# Patient Record
Sex: Female | Born: 1960 | Race: White | Hispanic: No | Marital: Married | Smoking: Former smoker
Health system: Southern US, Community
[De-identification: ages and names within clinical notes are randomized; demographics above are authoritative.]

## PROBLEM LIST (undated history)

## (undated) DIAGNOSIS — T8859XA Other complications of anesthesia, initial encounter: Secondary | ICD-10-CM

## (undated) DIAGNOSIS — N63 Unspecified lump in unspecified breast: Secondary | ICD-10-CM

## (undated) DIAGNOSIS — K219 Gastro-esophageal reflux disease without esophagitis: Secondary | ICD-10-CM

## (undated) DIAGNOSIS — R519 Headache, unspecified: Secondary | ICD-10-CM

## (undated) DIAGNOSIS — E785 Hyperlipidemia, unspecified: Secondary | ICD-10-CM

## (undated) DIAGNOSIS — D649 Anemia, unspecified: Secondary | ICD-10-CM

## (undated) DIAGNOSIS — T4145XA Adverse effect of unspecified anesthetic, initial encounter: Secondary | ICD-10-CM

## (undated) DIAGNOSIS — R51 Headache: Secondary | ICD-10-CM

## (undated) DIAGNOSIS — E119 Type 2 diabetes mellitus without complications: Secondary | ICD-10-CM

## (undated) DIAGNOSIS — F419 Anxiety disorder, unspecified: Secondary | ICD-10-CM

## (undated) DIAGNOSIS — E1140 Type 2 diabetes mellitus with diabetic neuropathy, unspecified: Secondary | ICD-10-CM

## (undated) DIAGNOSIS — J40 Bronchitis, not specified as acute or chronic: Secondary | ICD-10-CM

## (undated) HISTORY — DX: Type 2 diabetes mellitus with diabetic neuropathy, unspecified: E11.40

## (undated) HISTORY — DX: Gastro-esophageal reflux disease without esophagitis: K21.9

## (undated) HISTORY — PX: FOOT SURGERY: SHX648

## (undated) HISTORY — PX: CLAVICLE SURGERY: SHX598

## (undated) HISTORY — DX: Type 2 diabetes mellitus without complications: E11.9

## (undated) HISTORY — PX: SHOULDER SURGERY: SHX246

## (undated) HISTORY — DX: Hyperlipidemia, unspecified: E78.5

---

## 2007-04-06 ENCOUNTER — Ambulatory Visit: Payer: Self-pay | Admitting: Family Medicine

## 2007-04-24 ENCOUNTER — Ambulatory Visit: Payer: Self-pay | Admitting: Family Medicine

## 2007-08-10 ENCOUNTER — Ambulatory Visit: Payer: Self-pay | Admitting: Family Medicine

## 2013-09-18 ENCOUNTER — Emergency Department: Payer: Self-pay | Admitting: Emergency Medicine

## 2013-09-18 LAB — TROPONIN I: Troponin-I: <0.02 ng/mL

## 2013-09-18 LAB — CBC
HCT: 42.6 % (ref 35.0–47.0)
HGB: 14.7 g/dL (ref 12.0–16.0)
MCH: 28.5 pg (ref 26.0–34.0)
MCHC: 34.4 g/dL (ref 32.0–36.0)
MCV: 83 fL (ref 80–100)
PLATELETS: 224 10*3/uL (ref 150–440)
RBC: 5.16 10*6/uL (ref 3.80–5.20)
RDW: 13.6 % (ref 11.5–14.5)
WBC: 6.4 10*3/uL (ref 3.6–11.0)

## 2013-09-18 LAB — PRO B NATRIURETIC PEPTIDE: B-Type Natriuretic Peptide: 86 pg/mL (ref 0–125)

## 2013-09-18 LAB — BASIC METABOLIC PANEL
Anion Gap: 9 (ref 7–16)
BUN: 12 mg/dL (ref 7–18)
CHLORIDE: 102 mmol/L (ref 98–107)
CO2: 25 mmol/L (ref 21–32)
Calcium, Total: 9 mg/dL (ref 8.5–10.1)
Creatinine: 0.75 mg/dL (ref 0.60–1.30)
EGFR (Non-African Amer.): >60
Glucose: 267 mg/dL — ABNORMAL HIGH (ref 65–99)
OSMOLALITY: 281 (ref 275–301)
POTASSIUM: 3.6 mmol/L (ref 3.5–5.1)
SODIUM: 136 mmol/L (ref 136–145)

## 2014-03-14 LAB — BASIC METABOLIC PANEL
BUN: 12 mg/dL (ref 4–21)
CREATININE: 0.6 mg/dL (ref <=1.1)
Glucose: 105 mg/dL

## 2014-03-14 LAB — LIPID PANEL
Cholesterol: 233 mg/dL — AB (ref 0–200)
HDL: 43 mg/dL (ref 35–70)
LDL Cholesterol: 156 mg/dL
Triglycerides: 171 mg/dL — AB (ref 40–160)

## 2014-03-25 ENCOUNTER — Ambulatory Visit: Payer: Self-pay | Admitting: Family Medicine

## 2014-03-27 ENCOUNTER — Ambulatory Visit: Payer: Self-pay | Admitting: Family Medicine

## 2014-04-27 ENCOUNTER — Ambulatory Visit: Payer: Self-pay | Admitting: Family Medicine

## 2014-10-01 DIAGNOSIS — E782 Mixed hyperlipidemia: Secondary | ICD-10-CM | POA: Insufficient documentation

## 2014-10-01 DIAGNOSIS — K2100 Gastro-esophageal reflux disease with esophagitis, without bleeding: Secondary | ICD-10-CM | POA: Insufficient documentation

## 2014-10-30 LAB — HEMOGLOBIN A1C: Hgb A1c MFr Bld: 7 % — AB (ref 4.0–6.0)

## 2014-11-07 DIAGNOSIS — E1142 Type 2 diabetes mellitus with diabetic polyneuropathy: Secondary | ICD-10-CM | POA: Insufficient documentation

## 2014-11-07 DIAGNOSIS — E663 Overweight: Secondary | ICD-10-CM | POA: Insufficient documentation

## 2014-11-07 DIAGNOSIS — E119 Type 2 diabetes mellitus without complications: Secondary | ICD-10-CM | POA: Insufficient documentation

## 2014-11-07 DIAGNOSIS — R739 Hyperglycemia, unspecified: Secondary | ICD-10-CM | POA: Insufficient documentation

## 2015-05-18 ENCOUNTER — Other Ambulatory Visit: Payer: Self-pay | Admitting: Family Medicine

## 2015-05-20 ENCOUNTER — Encounter: Payer: Self-pay | Admitting: Family Medicine

## 2015-05-20 ENCOUNTER — Ambulatory Visit (INDEPENDENT_AMBULATORY_CARE_PROVIDER_SITE_OTHER): Payer: BC Managed Care – PPO | Admitting: Family Medicine

## 2015-05-20 VITALS — BP 130/80 | HR 64 | Ht 62.0 in | Wt 254.0 lb

## 2015-05-20 DIAGNOSIS — E1142 Type 2 diabetes mellitus with diabetic polyneuropathy: Secondary | ICD-10-CM | POA: Diagnosis not present

## 2015-05-20 MED ORDER — METFORMIN HCL 1000 MG PO TABS: 1000.0000 mg | ORAL_TABLET | Freq: Two times a day (BID) | ORAL | Status: AC

## 2015-05-20 MED ORDER — LIRAGLUTIDE 18 MG/3ML ~~LOC~~ SOPN: 1.8000 mg | PEN_INJECTOR | Freq: Every day | SUBCUTANEOUS | Status: DC

## 2015-05-20 MED ORDER — PREGABALIN 150 MG PO CAPS: 150.0000 mg | ORAL_CAPSULE | Freq: Two times a day (BID) | ORAL | Status: DC

## 2015-05-20 NOTE — Progress Notes (Signed)
Name: Carolyn Marquez   MRN: [id number]    DOB: 30-Oct-1960   Date:05/20/2015       Progress Note  Subjective  Chief Complaint  Chief Complaint  Patient presents with  . Diabetes    Diabetes She presents for her follow-up diabetic visit. She has type 2 diabetes mellitus. Her disease course has been stable. Pertinent negatives for hypoglycemia include no confusion, dizziness, headaches, hunger, mood changes, nervousness/anxiousness, pallor, seizures, sleepiness, speech difficulty, sweats or tremors. Pertinent negatives for diabetes include no blurred vision, no chest pain, no fatigue, no foot paresthesias, no foot ulcerations, no polydipsia, no polyphagia, no polyuria, no visual change, no weakness and no weight loss. (Neuropathy) There are no hypoglycemic complications. Symptoms are worsening. Diabetic complications include peripheral neuropathy. Pertinent negatives for diabetic complications include no autonomic neuropathy, CVA, heart disease, impotence, nephropathy, PVD or retinopathy. Risk factors for coronary artery disease include diabetes mellitus and dyslipidemia. Current diabetic treatment includes oral agent (monotherapy) (with victoza). She is compliant with treatment all of the time. She is following a generally healthy and diabetic (attended lifestyle) diet. She has had a previous visit with a dietitian. She participates in exercise intermittently. Her home blood glucose trend is fluctuating minimally. Her breakfast blood glucose is taken between 8-9 am. Her breakfast blood glucose range is generally 140-180 mg/dl. An ACE inhibitor/angiotensin II receptor blocker is not being taken. She sees a podiatrist.Eye exam is current.    No problem-specific assessment & plan notes found for this encounter.   Past Medical History  Diagnosis Date  . Diabetes mellitus without complication (HCC)   . Diabetic neuropathy [facility])     Past Surgical History  Procedure Laterality Date  . Cesarean section     . Foot surgery    . Shoulder surgery      Family History  Problem Relation Age of Onset  . Cancer Father   . Diabetes Sister     Social History   Social History  . Marital Status: Married    Spouse Name: N/A  . Number of Children: N/A  . Years of Education: N/A   Occupational History  . Not on file.   Social History Main Topics  . Smoking status: Former Games developer  . Smokeless tobacco: Not on file  . Alcohol Use: No  . Drug Use: No  . Sexual Activity: No   Other Topics Concern  . Not on file   Social History Narrative    No Known Allergies   Review of Systems  Constitutional: Negative for fever, chills, weight loss, malaise/fatigue and fatigue.  HENT: Negative for ear discharge, ear pain and sore throat.   Eyes: Negative for blurred vision.  Respiratory: Negative for cough, sputum production, shortness of breath and wheezing.   Cardiovascular: Negative for chest pain, palpitations and leg swelling.  Gastrointestinal: Negative for heartburn, nausea, abdominal pain, diarrhea, constipation, blood in stool and melena.  Genitourinary: Negative for dysuria, urgency, frequency, hematuria and impotence.  Musculoskeletal: Negative for myalgias, back pain, joint pain and neck pain.  Skin: Negative for pallor and rash.  Neurological: Negative for dizziness, tingling, tremors, sensory change, focal weakness, seizures, speech difficulty, weakness and headaches.  Endo/Heme/Allergies: Negative for environmental allergies, polydipsia and polyphagia. Does not bruise/bleed easily.  Psychiatric/Behavioral: Negative for depression, suicidal ideas and confusion. The patient is not nervous/anxious and does not have insomnia.      Objective  Filed Vitals:   05/20/15 0758  BP: 130/80  Pulse: 64  Height:  (1.575  m)  Weight: 254 lb (115.214 kg)    Physical Exam  Constitutional: She is well-developed, well-nourished, and in no distress. No distress.  HENT:  Head:  Normocephalic and atraumatic.  Right Ear: External ear normal.  Left Ear: External ear normal.  Nose: Nose normal.  Mouth/Throat: Oropharynx is clear and moist.  Eyes: Conjunctivae and EOM are normal. Pupils are equal, round, and reactive to light. Right eye exhibits no discharge. Left eye exhibits no discharge.  Neck: Normal range of motion. Neck supple. No JVD present. No thyromegaly present.  Cardiovascular: Normal rate, regular rhythm, normal heart sounds and intact distal pulses.  Exam reveals no gallop and no friction rub.   No murmur heard. Pulmonary/Chest: Effort normal and breath sounds normal.  Abdominal: Soft. Bowel sounds are normal. She exhibits no mass. There is no tenderness. There is no guarding.  Musculoskeletal: Normal range of motion. She exhibits no edema.  Lymphadenopathy:    She has no cervical adenopathy.  Neurological: She is alert.  Skin: Skin is warm and dry. She is not diaphoretic.  Psychiatric: Mood and affect normal.      Assessment & Plan  Problem List Items Addressed This Visit      Nervous and Auditory   Diabetes mellitus with polyneuropathy (HCC) - Primary   Relevant Medications   Liraglutide 18 MG/3ML SOPN   metFORMIN (GLUCOPHAGE) 1000 MG tablet   pregabalin (LYRICA) 150 MG capsule   Other Relevant Orders   HgB A1c   Renal Function Panel        Dr. Elizabeth Sauereanna Fabricio [facility]  05/20/2015

## 2015-05-21 LAB — RENAL FUNCTION PANEL
Albumin: 4.1 g/dL (ref 3.5–5.5)
BUN/Creatinine Ratio: 20 (ref 9–23)
BUN: 9 mg/dL (ref 6–24)
CO2: 23 mmol/L (ref 18–29)
CREATININE: 0.46 mg/dL — AB (ref 0.57–1.00)
Calcium: 9.3 mg/dL (ref 8.7–10.2)
Chloride: 100 mmol/L (ref 97–106)
GFR, EST AFRICAN AMERICAN: 130 mL/min/{1.73_m2} (ref >59)
GFR, EST NON AFRICAN AMERICAN: 113 mL/min/{1.73_m2} (ref >59)
Glucose: 111 mg/dL — ABNORMAL HIGH (ref 65–99)
Phosphorus: 3.7 mg/dL (ref 2.5–4.5)
Potassium: 4.5 mmol/L (ref 3.5–5.2)
SODIUM: 141 mmol/L (ref 136–144)

## 2015-05-21 LAB — HEMOGLOBIN A1C
ESTIMATED AVERAGE GLUCOSE: 174 mg/dL
HEMOGLOBIN A1C: 7.7 % — AB (ref 4.8–5.6)

## 2015-05-25 ENCOUNTER — Other Ambulatory Visit: Payer: Self-pay

## 2015-05-25 DIAGNOSIS — E0849 Diabetes mellitus due to underlying condition with other diabetic neurological complication: Secondary | ICD-10-CM

## 2015-05-25 MED ORDER — GABAPENTIN 300 MG PO CAPS: 300.0000 mg | ORAL_CAPSULE | Freq: Three times a day (TID) | ORAL | Status: DC

## 2015-06-01 ENCOUNTER — Other Ambulatory Visit: Payer: Self-pay

## 2015-06-01 DIAGNOSIS — E1165 Type 2 diabetes mellitus with hyperglycemia: Principal | ICD-10-CM

## 2015-06-01 DIAGNOSIS — E11319 Type 2 diabetes mellitus with unspecified diabetic retinopathy without macular edema: Secondary | ICD-10-CM

## 2015-06-23 ENCOUNTER — Ambulatory Visit: Payer: BC Managed Care – PPO | Admitting: Family Medicine

## 2015-06-25 ENCOUNTER — Ambulatory Visit (INDEPENDENT_AMBULATORY_CARE_PROVIDER_SITE_OTHER): Payer: BC Managed Care – PPO | Admitting: Family Medicine

## 2015-06-25 ENCOUNTER — Encounter: Payer: Self-pay | Admitting: Family Medicine

## 2015-06-25 VITALS — BP 124/80 | HR 76 | Ht 68.0 in | Wt 258.0 lb

## 2015-06-25 DIAGNOSIS — R079 Chest pain, unspecified: Secondary | ICD-10-CM

## 2015-06-25 DIAGNOSIS — E1140 Type 2 diabetes mellitus with diabetic neuropathy, unspecified: Secondary | ICD-10-CM

## 2015-06-25 DIAGNOSIS — E782 Mixed hyperlipidemia: Secondary | ICD-10-CM | POA: Diagnosis not present

## 2015-06-25 NOTE — Patient Instructions (Signed)
Diabetic Retinopathy Diabetic retinopathy is a disease of the light-sensitive membrane at the back of the eye (retina). It is a complication of diabetes and a common cause of blindness. Early detection of the disease is key to keeping your eyes healthy.  CAUSES  Diabetic retinopathy is caused by blood sugar (glucose) levels that are too high over an extended period of time. High blood sugars cause damage to the small blood vessels of the retina, allowing blood to leak through the vessel walls. This causes visual impairment and eventually blindness. RISK FACTORS  High blood pressure.  Having diabetes for a long time.  Having poorly controlled blood sugars. SIGNS AND SYMPTOMS  In the early stages of diabetic retinopathy, there are often no symptoms. As the condition advances, symptoms may include:  Blurred vision. This is usually caused by a swelling due to abnormal blood glucose levels. The blurriness may go away when blood glucose levels return to normal.  Moving specks or dark spots (floaters) in your vision. These can be caused by a small retinal hemorrhage. A hemorrhage is bleeding from blood vessels.  Missing parts of your field of vision, such as things at the side. This can be caused by larger retinal hemorrhages.  Difficulty reading books or signs.  Double vision.  Pain in one or both eyes.  Feeling pressure in one or both eyes.  Trouble seeing straight lines. Straight lines do not look straight.  Redness of the eyes that does not go away. DIAGNOSIS  Your eye care specialist can detect changes in the blood vessels of your eye by putting drops in your eyes that enlarge (dilate) your pupils. This allows your eye care specialist to get a good look at your retina to see if there are any changes that have occurred as a result of your diabetes. You should have your eyes examined once a year. TREATMENT  Your eye care specialist may use a special laser beam to seal the blood vessels  of the retina and stop them from leaking. Early detection and treatment are important so that further damage to your eyes can be prevented. In addition, managing your blood sugars and keeping them in the target range can slow the progress of the disease. HOME CARE INSTRUCTIONS   Keep your blood pressure within your target range.  Keep your blood glucose levels within your target range.  Follow your health care provider's instructions regarding diet and other means for controlling your blood glucose levels.  Check your blood levels for glucose as recommended by your health care provider.  Keep regular appointments with your eye specialist. An eye specialist can usually see diabetic retinopathy developing long before it starts causing problems. In many cases, it can be treated to prevent complications from occurring. If you have diabetes, you should have your eyes checked at least every year. Your risk of retinopathy increases the longer you have the disease.  If you smoke, quit. Ask your health care provider for help if needed. Smoking can make retinopathy worse. SEEK MEDICAL CARE IF:   You notice gradual blurring or other changes in your vision over time.  You notice that your glasses or contact lenses do not make things look as sharp as they once did.  You have trouble reading or seeing details at a distance with either eye.  You notice a sudden change in your vision or notice that parts of your field of vision appear missing or hazy.  You suddenly see moving specks or dark spots   in the field of vision of either eye.  You have sudden partial loss of vision in either eye.   This information is not intended to replace advice given to you by your health care provider. Make sure you discuss any questions you have with your health care provider.   Document Released: 06/10/2000 Document Revised: 04/03/2013 Document Reviewed: 12/03/2012 Elsevier Interactive Patient Education 2016 [employer]  Inc[employer]  

## 2015-06-25 NOTE — Progress Notes (Signed)
Name: Carolyn Marquez   MRN: [id number]    DOB: 1961-01-24   Date:06/25/2015       Progress Note  Subjective  Chief Complaint  Chief Complaint  Patient presents with  . Diabetes    Diabetes She presents for her follow-up diabetic visit. She has type 2 diabetes mellitus. Her disease course has been fluctuating. There are no hypoglycemic associated symptoms. Pertinent negatives for hypoglycemia include no confusion, dizziness, headaches, hunger, mood changes, nervousness/anxiousness, pallor, seizures, sleepiness, speech difficulty, sweats or tremors. Associated symptoms include chest pain and foot paresthesias. Pertinent negatives for diabetes include no blurred vision, no fatigue, no foot ulcerations, no polydipsia, no polyphagia, no polyuria, no visual change, no weakness and no weight loss. There are no hypoglycemic complications. Pertinent negatives for hypoglycemia complications include no blackouts, no hospitalization, no nocturnal hypoglycemia, no required assistance and no required glucagon injection. Symptoms are stable. Diabetic complications include peripheral neuropathy. Pertinent negatives for diabetic complications include no CVA, heart disease, PVD or retinopathy. Risk factors for coronary artery disease include diabetes mellitus and dyslipidemia. Her weight is stable. She is following a generally healthy diet. She has had a previous visit with a dietitian. Her home blood glucose trend is fluctuating minimally. Her breakfast blood glucose is taken between 8-9 am. Her breakfast blood glucose range is generally 130-140 mg/dl. An ACE inhibitor/angiotensin II receptor blocker is not being taken. She does not see a podiatrist.Eye exam is current.  Chest Pain  This is a chronic problem. The current episode started more than 1 year ago. The onset quality is gradual. The problem occurs intermittently. The problem has been waxing and waning. The pain is present in the substernal region. The pain is at  a severity of 4/10. The quality of the pain is described as tightness. The pain does not radiate. Pertinent negatives include no abdominal pain, back pain, cough, dizziness, exertional chest pressure, fever, headaches, irregular heartbeat, malaise/fatigue, nausea, palpitations, shortness of breath, sputum production or weakness.  Pertinent negatives for past medical history include no heart disease, no PVD and no seizures.    No problem-specific assessment & plan notes found for this encounter.   Past Medical History  Diagnosis Date  . Diabetes mellitus without complication (HCC)   . Diabetic neuropathy [facility])     Past Surgical History  Procedure Laterality Date  . Cesarean section    . Foot surgery    . Shoulder surgery      Family History  Problem Relation Age of Onset  . Cancer Father   . Diabetes Sister     Social History   Social History  . Marital Status: Married    Spouse Name: N/A  . Number of Children: N/A  . Years of Education: N/A   Occupational History  . Not on file.   Social History Main Topics  . Smoking status: Former Games developer  . Smokeless tobacco: Not on file  . Alcohol Use: No  . Drug Use: No  . Sexual Activity: No   Other Topics Concern  . Not on file   Social History Narrative    No Known Allergies   Review of Systems  Constitutional: Negative for fever, chills, weight loss, malaise/fatigue and fatigue.  HENT: Negative for ear discharge, ear pain and sore throat.   Eyes: Negative for blurred vision.  Respiratory: Negative for cough, sputum production, shortness of breath and wheezing.   Cardiovascular: Positive for chest pain. Negative for palpitations and leg swelling.  Gastrointestinal: Negative for heartburn,  nausea, abdominal pain, diarrhea, constipation, blood in stool and melena.  Genitourinary: Negative for dysuria, urgency, frequency and hematuria.  Musculoskeletal: Negative for myalgias, back pain, joint pain and neck pain.   Skin: Negative for pallor and rash.  Neurological: Negative for dizziness, tingling, tremors, sensory change, focal weakness, seizures, speech difficulty, weakness and headaches.  Endo/Heme/Allergies: Negative for environmental allergies, polydipsia and polyphagia. Does not bruise/bleed easily.  Psychiatric/Behavioral: Negative for depression, suicidal ideas and confusion. The patient is not nervous/anxious and does not have insomnia.      Objective  Filed Vitals:   06/25/15 0857  BP: 124/80  Pulse: 76  Height: 5\' 8"  (1.727 m)  Weight: 258 lb (117.028 kg)    Physical Exam  Constitutional: She is well-developed, well-nourished, and in no distress. No distress.  HENT:  Head: Normocephalic and atraumatic.  Right Ear: External ear normal.  Left Ear: External ear normal.  Nose: Nose normal.  Mouth/Throat: Oropharynx is clear and moist.  Eyes: Conjunctivae and EOM are normal. Pupils are equal, round, and reactive to light. Right eye exhibits no discharge. Left eye exhibits no discharge.  Neck: Normal range of motion. Neck supple. No JVD present. No thyromegaly present.  Cardiovascular: Normal rate, regular rhythm, normal heart sounds and intact distal pulses.  Exam reveals no gallop and no friction rub.   No murmur heard. Pulmonary/Chest: Effort normal and breath sounds normal.  Abdominal: Soft. Bowel sounds are normal. She exhibits no mass. There is no tenderness. There is no guarding.  Musculoskeletal: Normal range of motion. She exhibits no edema.  Lymphadenopathy:    She has no cervical adenopathy.  Neurological: She is alert. She has normal sensation, normal strength and normal reflexes.  Skin: Skin is warm and dry. She is not diaphoretic.  Psychiatric: Mood and affect normal.  Nursing note and vitals reviewed.     Assessment & Plan  Problem List Items Addressed This Visit      Endocrine   Diabetes mellitus, type 2 (HCC) - Primary   Relevant Orders   HgB A1c   Urine  Microalbumin w/creat. ratio   Renal Function Panel     Other   Combined fat and carbohydrate induced hyperlipemia   Relevant Orders   Lipid Profile    Other Visit Diagnoses    Chest pain at rest        sceduled appt for stress echo         Dr. Hayden Rasmusseneanna Areana Kosanke [facility]  06/25/2015

## 2015-06-26 LAB — LIPID PANEL
CHOL/HDL RATIO: 5.6 ratio — AB (ref 0.0–4.4)
Cholesterol, Total: 253 mg/dL — ABNORMAL HIGH (ref 100–199)
HDL: 45 mg/dL (ref >39)
LDL CALC: 146 mg/dL — AB (ref 0–99)
TRIGLYCERIDES: 309 mg/dL — AB (ref 0–149)
VLDL CHOLESTEROL CAL: 62 mg/dL — AB (ref 5–40)

## 2015-06-26 LAB — RENAL FUNCTION PANEL
Albumin: 4 g/dL (ref 3.5–5.5)
BUN / CREAT RATIO: 22 (ref 9–23)
BUN: 11 mg/dL (ref 6–24)
CO2: 22 mmol/L (ref 18–29)
CREATININE: 0.51 mg/dL — AB (ref 0.57–1.00)
Calcium: 9.1 mg/dL (ref 8.7–10.2)
Chloride: 97 mmol/L (ref 96–106)
GFR calc Af Amer: 126 mL/min/{1.73_m2} (ref >59)
GFR, EST NON AFRICAN AMERICAN: 109 mL/min/{1.73_m2} (ref >59)
Glucose: 179 mg/dL — ABNORMAL HIGH (ref 65–99)
Phosphorus: 3.8 mg/dL (ref 2.5–4.5)
Potassium: 4.4 mmol/L (ref 3.5–5.2)
SODIUM: 139 mmol/L (ref 134–144)

## 2015-06-26 LAB — HEMOGLOBIN A1C
Est. average glucose Bld gHb Est-mCnc: 174 mg/dL
Hgb A1c MFr Bld: 7.7 % — ABNORMAL HIGH (ref 4.8–5.6)

## 2015-06-26 LAB — MICROALBUMIN / CREATININE URINE RATIO
Creatinine, Urine: 133.3 mg/dL
MICROALB/CREAT RATIO: 4.9 mg/g{creat} (ref 0.0–30.0)
MICROALBUM., U, RANDOM: 6.5 ug/mL

## 2015-06-26 NOTE — Addendum Note (Signed)
Addended by: Everitt AmberLYNCH, Herson Prichard L on: 06/26/2015 03:12 PM   Modules accepted: Orders

## 2015-07-15 DIAGNOSIS — R0681 Apnea, not elsewhere classified: Secondary | ICD-10-CM | POA: Insufficient documentation

## 2015-07-22 ENCOUNTER — Ambulatory Visit (INDEPENDENT_AMBULATORY_CARE_PROVIDER_SITE_OTHER): Payer: BC Managed Care – PPO | Admitting: Family Medicine

## 2015-07-22 ENCOUNTER — Encounter: Payer: Self-pay | Admitting: Family Medicine

## 2015-07-22 VITALS — BP 130/70 | HR 84 | Ht 68.0 in | Wt 249.0 lb

## 2015-07-22 DIAGNOSIS — J0110 Acute frontal sinusitis, unspecified: Secondary | ICD-10-CM | POA: Diagnosis not present

## 2015-07-22 MED ORDER — AMOXICILLIN-POT CLAVULANATE 875-125 MG PO TABS
1.0000 | ORAL_TABLET | Freq: Two times a day (BID) | ORAL | Status: DC
Start: 2015-07-22 — End: 2015-09-07

## 2015-07-22 NOTE — Progress Notes (Signed)
Name: Carolyn Marquez   MRN: [id number]    DOB: [birth year]   Date:07/22/2015       Progress Note  Subjective  Chief Complaint  Chief Complaint  Patient presents with  . Sinusitis    head pressure x 3 days- sudafed and ibuprofen otc not helping. Cough- light green production    Sinusitis This is a new problem. The current episode started in the past 7 days. The problem has been gradually worsening since onset. There has been no fever. Her pain is at a severity of 6/10. The pain is moderate. Associated symptoms include congestion, diaphoresis, ear pain, headaches, sinus pressure and sneezing. Pertinent negatives include no chills, coughing, neck pain, shortness of breath or sore throat. Past treatments include acetaminophen and oral decongestants. The treatment provided no relief.    No problem-specific assessment & plan notes found for this encounter.   Past Medical History  Diagnosis Date  . Diabetes mellitus without complication (HCC)   . Diabetic neuropathy [facility])     Past Surgical History  Procedure Laterality Date  . Cesarean section    . Foot surgery    . Shoulder surgery      Family History  Problem Relation Age of Onset  . Cancer Father   . Diabetes Sister     Social History   Social History  . Marital Status: Married    Spouse Name: N/A  . Number of Children: N/A  . Years of Education: N/A   Occupational History  . Not on file.   Social History Main Topics  . Smoking status: Former Games developer  . Smokeless tobacco: Not on file  . Alcohol Use: No  . Drug Use: No  . Sexual Activity: No   Other Topics Concern  . Not on file   Social History Narrative    No Known Allergies   Review of Systems  Constitutional: Positive for diaphoresis. Negative for fever, chills, weight loss and malaise/fatigue.  HENT: Positive for congestion, ear pain, sinus pressure and sneezing. Negative for ear discharge and sore throat.   Eyes: Negative for blurred vision.   Respiratory: Negative for cough, sputum production, shortness of breath and wheezing.   Cardiovascular: Negative for chest pain, palpitations and leg swelling.  Gastrointestinal: Positive for nausea. Negative for heartburn, abdominal pain, diarrhea, constipation, blood in stool and melena.  Genitourinary: Negative for dysuria, urgency, frequency and hematuria.  Musculoskeletal: Negative for myalgias, back pain, joint pain and neck pain.  Skin: Negative for rash.  Neurological: Positive for headaches. Negative for dizziness, tingling, sensory change and focal weakness.  Endo/Heme/Allergies: Negative for environmental allergies and polydipsia. Does not bruise/bleed easily.  Psychiatric/Behavioral: Negative for depression and suicidal ideas. The patient is not nervous/anxious and does not have insomnia.      Objective  Filed Vitals:   07/22/15 1041  BP: 130/70  Pulse: 84  Height:  (1.727 m)  Weight: 249 lb (112.946 kg)    Physical Exam  Constitutional: She is well-developed, well-nourished, and in no distress. No distress.  HENT:  Head: Normocephalic and atraumatic.  Right Ear: External ear normal.  Left Ear: External ear normal.  Nose: Nose normal.  Mouth/Throat: Oropharynx is clear and moist.  Eyes: Conjunctivae and EOM are normal. Pupils are equal, round, and reactive to light. Right eye exhibits no discharge. Left eye exhibits no discharge.  Neck: Normal range of motion. Neck supple. No JVD present. No thyromegaly present.  Cardiovascular: Normal rate, regular rhythm, normal heart sounds and intact  distal pulses.  Exam reveals no gallop and no friction rub.   No murmur heard. Pulmonary/Chest: Effort normal and breath sounds normal. She has no wheezes. She has no rales.  Abdominal: Soft. Bowel sounds are normal. She exhibits no mass. There is no tenderness. There is no guarding.  Musculoskeletal: Normal range of motion. She exhibits no edema.  Lymphadenopathy:    She has  no cervical adenopathy.  Neurological: She is alert.  Skin: Skin is warm and dry. She is not diaphoretic.  Psychiatric: Mood and affect normal.      Assessment & Plan  Problem List Items Addressed This Visit    None    Visit Diagnoses    Acute frontal sinusitis, recurrence not specified    -  Primary    Relevant Medications    amoxicillin-clavulanate (AUGMENTIN) 875-125 MG tablet         Dr. [facility]den [facility]  07/22/2015

## 2015-08-18 ENCOUNTER — Encounter: Payer: Self-pay | Admitting: Family Medicine

## 2015-08-18 ENCOUNTER — Ambulatory Visit (INDEPENDENT_AMBULATORY_CARE_PROVIDER_SITE_OTHER): Payer: BC Managed Care – PPO | Admitting: Family Medicine

## 2015-08-18 VITALS — BP 120/78 | HR 76 | Temp 98.0°F | Wt 243.8 lb

## 2015-08-18 DIAGNOSIS — J0100 Acute maxillary sinusitis, unspecified: Secondary | ICD-10-CM

## 2015-08-18 DIAGNOSIS — J40 Bronchitis, not specified as acute or chronic: Secondary | ICD-10-CM | POA: Diagnosis not present

## 2015-08-18 MED ORDER — GUAIFENESIN-CODEINE 100-10 MG/5ML PO SYRP: 5.0000 mL | ORAL_SOLUTION | Freq: Three times a day (TID) | ORAL | Status: DC | PRN

## 2015-08-18 MED ORDER — AZITHROMYCIN 250 MG PO TABS: ORAL_TABLET | ORAL | Status: DC

## 2015-08-18 MED ORDER — ALBUTEROL SULFATE HFA 108 (90 BASE) MCG/ACT IN AERS: 2.0000 | INHALATION_SPRAY | Freq: Four times a day (QID) | RESPIRATORY_TRACT | Status: DC | PRN

## 2015-08-18 NOTE — Progress Notes (Signed)
Name: Carolyn Marquez   MRN: [id number]    DOB: [birth year]   Date:08/18/2015       Progress Note  Subjective  Chief Complaint  Chief Complaint  Patient presents with  . Sinusitis    cough and cong    Sinusitis This is a new problem. The current episode started in the past 7 days. The problem has been waxing and waning since onset. There has been no fever. The pain is mild. Pertinent negatives include no chills, congestion, coughing, diaphoresis, ear pain, headaches, hoarse voice, neck pain, shortness of breath, sinus pressure, sneezing, sore throat or swollen glands. Past treatments include acetaminophen. The treatment provided mild relief.  Cough This is a new problem. The cough is non-productive. Pertinent negatives include no chest pain, chills, ear pain, fever, headaches, heartburn, myalgias, rash, sore throat, shortness of breath, weight loss or wheezing. There is no history of environmental allergies.    No problem-specific assessment & plan notes found for this encounter.   Past Medical History  Diagnosis Date  . Diabetes mellitus without complication (HCC)   . Diabetic neuropathy [facility])     Past Surgical History  Procedure Laterality Date  . Cesarean section    . Foot surgery    . Shoulder surgery      Family History  Problem Relation Age of Onset  . Cancer Father   . Diabetes Sister     Social History   Social History  . Marital Status: Married    Spouse Name: N/A  . Number of Children: N/A  . Years of Education: N/A   Occupational History  . Not on file.   Social History Main Topics  . Smoking status: Former Games developer  . Smokeless tobacco: Not on file  . Alcohol Use: No  . Drug Use: No  . Sexual Activity: No   Other Topics Concern  . Not on file   Social History Narrative    No Known Allergies   Review of Systems  Constitutional: Negative for fever, chills, weight loss, malaise/fatigue and diaphoresis.  HENT: Negative for congestion, ear  discharge, ear pain, hoarse voice, sinus pressure, sneezing and sore throat.   Eyes: Negative for blurred vision.  Respiratory: Negative for cough, sputum production, shortness of breath and wheezing.   Cardiovascular: Negative for chest pain, palpitations and leg swelling.  Gastrointestinal: Negative for heartburn, nausea, abdominal pain, diarrhea, constipation, blood in stool and melena.  Genitourinary: Negative for dysuria, urgency, frequency and hematuria.  Musculoskeletal: Negative for myalgias, back pain, joint pain and neck pain.  Skin: Negative for rash.  Neurological: Negative for dizziness, tingling, sensory change, focal weakness and headaches.  Endo/Heme/Allergies: Negative for environmental allergies and polydipsia. Does not bruise/bleed easily.  Psychiatric/Behavioral: Negative for depression and suicidal ideas. The patient is not nervous/anxious and does not have insomnia.      Objective  Filed Vitals:   08/18/15 0843  BP: 120/78  Pulse: 76  Temp: 98 F (36.7 C)  TempSrc: Oral  Weight: 243 lb 12.8 oz (110.587 kg)  SpO2: 97%    Physical Exam  Constitutional: She is well-developed, well-nourished, and in no distress. No distress.  HENT:  Head: Normocephalic and atraumatic.  Right Ear: External ear normal.  Left Ear: External ear normal.  Nose: Nose normal.  Mouth/Throat: Oropharynx is clear and moist.  Eyes: Conjunctivae and EOM are normal. Pupils are equal, round, and reactive to light. Right eye exhibits no discharge. Left eye exhibits no discharge.  Neck: Normal range of  motion. Neck supple. No JVD present. No thyromegaly present.  Cardiovascular: Normal rate, regular rhythm, normal heart sounds and intact distal pulses.  Exam reveals no gallop and no friction rub.   No murmur heard. Pulmonary/Chest: Effort normal and breath sounds normal.  Abdominal: Soft. Bowel sounds are normal. She exhibits no mass. There is no tenderness. There is no guarding.   Musculoskeletal: Normal range of motion. She exhibits no edema.  Lymphadenopathy:    She has no cervical adenopathy.  Neurological: She is alert. She has normal reflexes.  Skin: Skin is warm and dry. She is not diaphoretic.  Psychiatric: Mood and affect normal.  Nursing note and vitals reviewed.     Assessment & Plan  Problem List Items Addressed This Visit    None    Visit Diagnoses    Acute maxillary sinusitis, recurrence not specified    -  Primary    Relevant Medications    azithromycin (ZITHROMAX Z-PAK) 250 MG tablet    guaiFENesin-codeine (ROBITUSSIN AC) 100-10 MG/5ML syrup    Bronchitis        Relevant Medications    azithromycin (ZITHROMAX Z-PAK) 250 MG tablet    guaiFENesin-codeine (ROBITUSSIN AC) 100-10 MG/5ML syrup    albuterol (PROVENTIL HFA;VENTOLIN HFA) 108 (90 Base) MCG/ACT inhaler         Dr. [facility]  08/18/2015

## 2015-09-07 ENCOUNTER — Ambulatory Visit (INDEPENDENT_AMBULATORY_CARE_PROVIDER_SITE_OTHER): Payer: BC Managed Care – PPO | Admitting: Family Medicine

## 2015-09-07 ENCOUNTER — Encounter: Payer: Self-pay | Admitting: Family Medicine

## 2015-09-07 VITALS — BP 120/84 | HR 76 | Ht 63.0 in | Wt 243.0 lb

## 2015-09-07 DIAGNOSIS — N63 Unspecified lump in breast: Secondary | ICD-10-CM

## 2015-09-07 DIAGNOSIS — N6320 Unspecified lump in the left breast, unspecified quadrant: Secondary | ICD-10-CM

## 2015-09-07 NOTE — Progress Notes (Signed)
Name: Carolyn Marquez   MRN: [id number]    DOB: 04/13/1961   Date:09/07/2015       Progress Note  Subjective  Chief Complaint  Chief Complaint  Patient presents with  . Breast Mass    felt it 1 week ago while adjusting bra- no pain or tenderness or discharge- last mammo has been "probably 10 years ago"    HPI Comments: Patient presents with palpable breast mass. (nontender/ niclel size/ no discharge)  Other This is a new problem. The current episode started in the past 7 days. Pertinent negatives include no abdominal pain, change in bowel habit, chest pain, chills, coughing, fever, headaches, myalgias, nausea, neck pain, rash or sore throat. Associated symptoms comments: Palpable breast mass. Nothing aggravates the symptoms.    No problem-specific assessment & plan notes found for this encounter.   Past Medical History  Diagnosis Date  . Diabetes mellitus without complication (HCC)   . Diabetic neuropathy [facility](HCC)     Past Surgical History  Procedure Laterality Date  . Cesarean section    . Foot surgery    . Shoulder surgery      Family History  Problem Relation Age of Onset  . Cancer Father   . Diabetes Sister     Social History   Social History  . Marital Status: Married    Spouse Name: N/A  . Number of Children: N/A  . Years of Education: N/A   Occupational History  . Not on file.   Social History Main Topics  . Smoking status: Former Games developermoker  . Smokeless tobacco: Not on file  . Alcohol Use: No  . Drug Use: No  . Sexual Activity: No   Other Topics Concern  . Not on file   Social History Narrative    No Known Allergies   Review of Systems  Constitutional: Negative for fever, chills, weight loss and malaise/fatigue.  HENT: Negative for ear discharge, ear pain and sore throat.   Eyes: Negative for blurred vision.  Respiratory: Negative for cough, sputum production, shortness of breath and wheezing.   Cardiovascular: Negative for chest pain, palpitations  and leg swelling.  Gastrointestinal: Negative for heartburn, nausea, abdominal pain, diarrhea, constipation, blood in stool, melena and change in bowel habit.  Genitourinary: Negative for dysuria, urgency, frequency and hematuria.  Musculoskeletal: Negative for myalgias, back pain, joint pain and neck pain.  Skin: Negative for rash.  Neurological: Negative for dizziness, tingling, sensory change, focal weakness and headaches.  Endo/Heme/Allergies: Negative for environmental allergies and polydipsia. Does not bruise/bleed easily.  Psychiatric/Behavioral: Negative for depression and suicidal ideas. The patient is not nervous/anxious and does not have insomnia.      Objective  Filed Vitals:   09/07/15 1349  BP: 120/84  Pulse: 76  Height: 5\' 3"  (1.6 m)  Weight: 243 lb (110.224 kg)    Physical Exam  Constitutional: She is well-developed, well-nourished, and in no distress. No distress.  HENT:  Head: Normocephalic and atraumatic.  Right Ear: External ear normal.  Left Ear: External ear normal.  Nose: Nose normal.  Mouth/Throat: Oropharynx is clear and moist.  Eyes: Conjunctivae and EOM are normal. Pupils are equal, round, and reactive to light. Right eye exhibits no discharge. Left eye exhibits no discharge.  Neck: Normal range of motion. Neck supple. No JVD present. No thyromegaly present.  Cardiovascular: Normal rate, regular rhythm, normal heart sounds and intact distal pulses.  Exam reveals no gallop and no friction rub.   No murmur heard. Pulmonary/Chest:  Effort normal and breath sounds normal. She has no wheezes. She has no rales. Right breast exhibits no inverted nipple, no mass, no nipple discharge, no skin change and no tenderness. Left breast exhibits mass. Left breast exhibits no inverted nipple, no nipple discharge, no skin change and no tenderness. Breasts are symmetrical.    Abdominal: Soft. Bowel sounds are normal. She exhibits no mass. There is no tenderness. There is no  guarding.  Musculoskeletal: Normal range of motion. She exhibits no edema.  Lymphadenopathy:       Right axillary: No pectoral and no lateral adenopathy present.       Left axillary: No pectoral and no lateral adenopathy present. Neurological: She is alert.  Skin: Skin is warm and dry. She is not diaphoretic.  Psychiatric: Mood and affect normal.      Assessment & Plan  Problem List Items Addressed This Visit    None    Visit Diagnoses    Breast mass, left    -  Primary    Relevant Orders    MM Digital Diagnostic Bilat    US BREAST COMPLETE UNI LEFT INC AXILLA         Dr. [facility]  09/07/2015

## 2015-09-10 ENCOUNTER — Other Ambulatory Visit: Payer: Self-pay

## 2015-09-10 DIAGNOSIS — N6320 Unspecified lump in the left breast, unspecified quadrant: Secondary | ICD-10-CM

## 2015-09-16 ENCOUNTER — Other Ambulatory Visit: Payer: Self-pay | Admitting: *Deleted

## 2015-09-16 ENCOUNTER — Inpatient Hospital Stay
Admission: RE | Admit: 2015-09-16 | Discharge: 2015-09-16 | Disposition: A | Discharge status: Home / Self Care | Payer: Self-pay | Source: Ambulatory Visit | Attending: *Deleted | Admitting: *Deleted

## 2015-09-16 DIAGNOSIS — Z9289 Personal history of other medical treatment: Secondary | ICD-10-CM

## 2015-09-30 ENCOUNTER — Ambulatory Visit
Admission: RE | Admit: 2015-09-30 | Discharge: 2015-09-30 | Disposition: A | Discharge status: Home / Self Care | Payer: BC Managed Care – PPO | Source: Ambulatory Visit | Attending: Family Medicine | Admitting: Family Medicine

## 2015-09-30 ENCOUNTER — Other Ambulatory Visit: Payer: Self-pay | Admitting: Family Medicine

## 2015-09-30 DIAGNOSIS — N6320 Unspecified lump in the left breast, unspecified quadrant: Secondary | ICD-10-CM

## 2015-09-30 DIAGNOSIS — N63 Unspecified lump in breast: Secondary | ICD-10-CM | POA: Diagnosis not present

## 2015-09-30 HISTORY — DX: Unspecified lump in unspecified breast: N63.0

## 2015-10-01 ENCOUNTER — Other Ambulatory Visit: Payer: Self-pay

## 2015-10-01 DIAGNOSIS — R928 Other abnormal and inconclusive findings on diagnostic imaging of breast: Secondary | ICD-10-CM

## 2015-10-04 ENCOUNTER — Other Ambulatory Visit: Payer: Self-pay

## 2015-10-07 ENCOUNTER — Telehealth: Payer: Self-pay | Admitting: Surgery

## 2015-10-07 ENCOUNTER — Encounter: Payer: Self-pay | Admitting: Surgery

## 2015-10-07 ENCOUNTER — Ambulatory Visit (INDEPENDENT_AMBULATORY_CARE_PROVIDER_SITE_OTHER): Payer: BC Managed Care – PPO | Admitting: Surgery

## 2015-10-07 VITALS — BP 127/71 | HR 80 | Temp 98.1°F | Ht 63.0 in | Wt 248.0 lb

## 2015-10-07 DIAGNOSIS — R928 Other abnormal and inconclusive findings on diagnostic imaging of breast: Secondary | ICD-10-CM

## 2015-10-07 NOTE — Patient Instructions (Signed)
We will arrange to do your Excisional Biopsy of your Left Breast on 10/22/15 with Dr. [facility].  You will only need to be out of work for that day and the following day, most likely. If your employer needs any documentation of this, please let us know and we will fax this over to them.  Please see your [facility] Pre-Care Sheet

## 2015-10-07 NOTE — Telephone Encounter (Signed)
Pt advised of pre op date/time and sx date. Sx: 10/22/15 with Dr Durwin Glazeooper--Excisional bx of left breast. Pre op: 10/15/15 between 9-1:00pm--Phone.   Patient made aware to call [phone], between 1-3:00pm the day before surgery, to find out what time to arrive.    Deductible: 1080.00 Co insurance: 30% Physician estimate: [phone]. Patient advised.

## 2015-10-07 NOTE — Progress Notes (Signed)
Surgical Consultation  10/07/2015  Carolyn Marquez is an 55 y.o. female.   CC: Abnormal left mammogram  HPI: This patient with abnormal left mammogram read as a BI-RADS 3 probably benign with fatty tissue. No family history of breast cancer. Patient does not do regular self exams but "accidentally" found a left breast mass lateral a month ago it is not been growing and does not cause her any pain. She denies any nipple discharge and has never had a breast biopsy before G3 P2 Ab1    Past Medical History  Diagnosis Date  . Diabetes mellitus without complication (HCC)   . Diabetic neuropathy (HCC)   . Breast mass     5 o'clock left present @ 1 month "quarter sized"    Past Surgical History  Procedure Laterality Date  . Cesarean section    . Foot surgery    . Shoulder surgery      Family History  Problem Relation Age of Onset  . Cancer Father   . Diabetes Sister   . Breast cancer Neg Hx     Social History:  reports that she has quit smoking. She does not have any smokeless tobacco history on file. She reports that she does not drink alcohol or use illicit drugs.  Allergies: No Known Allergies  Medications reviewed.   Review of Systems:   Review of Systems  Constitutional: Negative.   HENT: Negative.   Eyes: Negative.   Respiratory: Negative.   Cardiovascular: Negative.   Gastrointestinal: Negative.   Genitourinary: Negative.   Musculoskeletal: Negative.   Skin: Negative.   Neurological: Negative.   Endo/Heme/Allergies: Negative.   Psychiatric/Behavioral: Negative.      Physical Exam:  LMP 05/28/2015  Physical Exam  Constitutional: She is oriented to person, place, and time and well-developed, well-nourished, and in no distress. No distress.  HENT:  Head: Normocephalic and atraumatic.  Eyes: Pupils are equal, round, and reactive to light. Right eye exhibits no discharge. Left eye exhibits no discharge. No scleral icterus.  Neck: Normal range of motion.    Cardiovascular: Normal rate, regular rhythm and normal heart sounds.   Pulmonary/Chest: Effort normal and breath sounds normal. No respiratory distress. She has no wheezes. She has no rales.    Left lateral 8:00 breast mass measuring 3-4 cm soft and mobile  Abdominal: Soft. She exhibits no distension. There is no tenderness. There is no rebound.  Musculoskeletal: Normal range of motion. She exhibits no edema.  Lymphadenopathy:    She has no cervical adenopathy.  Neurological: She is alert and oriented to person, place, and time.  Skin: Skin is warm and dry. No rash noted. She is not diaphoretic. No erythema.  Psychiatric: Mood and affect normal.  Vitals reviewed.     No results found for this or any previous visit (from the past 48 hour(s)). No results found.  Assessment/Plan:  Abnormal mammogram on the left with a mass in a patient who does not do regular self exams. It was read as a BI-RADS 3 and the films been personally reviewed. There is a large palpable mass measuring 3-4 cm.  Discussed with the patient the options of observation versus repeat films versus excisional biopsy I am recommending excisional biopsy. I discussed with her the rationale for this the options of observation and the risks of bleeding infection recurrence positive margins requiring additional surgery the potential for this being a cancer versus a benign lesion and the risk of seroma drop and draining wound she understood  and agreed to proceed  Lattie Haw, MD, FACS

## 2015-10-15 ENCOUNTER — Encounter: Payer: Self-pay | Admitting: *Deleted

## 2015-10-15 ENCOUNTER — Other Ambulatory Visit: Payer: BC Managed Care – PPO

## 2015-10-15 NOTE — Patient Instructions (Addendum)
  Your procedure is scheduled on: 10-22-15 (THURSDAY) Report to [facility] SAME [facility] SURG[facility] 2ND FLOOR To find out your arrival time please call [phone] between 1PM - 3PM on 10-21-15 [facility](WEDNESDAY)  Remember: Instructions that are not followed completely may result in serious medical risk, up to and including death, or upon the discretion of your surgeon and anesthesiologist your surgery may need to be rescheduled.    _X___ 1. Do not eat food or drink liquids after midnight. No gum chewing or hard candies.     _X___ 2. No Alcohol for 24 hours before or after surgery.   ____ 3. Bring all medications with you on the day of surgery if instructed.    _X___ 4. Notify your doctor if there is any change in your medical condition     (cold, fever, infections).     Do not wear jewelry, make-up, hairpins, clips or nail polish.  Do not wear lotions, powders, or perfumes. You may wear deodorant.  Do not shave 48 hours prior to surgery. Men may shave face and neck.  Do not bring valuables to the hospital.    [facility] is not [facility] any belongings or valuables.               Contacts, dentures or bridgework may not be worn into surgery.  Leave your suitcase in the car. After surgery it may be brought to your room.  For patients admitted to the hospital, discharge time is determined by your treatment team.   Patients discharged the day of surgery will not be allowed to drive home.   Please read over the following fact sheets that you were given:      _X___ Take these medicines the morning of surgery with A SIP OF WATER:    1. CRESTOR  2.   3.   4.  5.  6.  ____ Fleet Enema (as directed)   _X___ Use CHG Soap as directed  ____ Use inhalers on the day of surgery  _X___ Stop metformin 2 days prior to surgery-LAST DOSE OF METFORMIN ON Monday(10-19-15)   _X___ Take 1/2 of usual insulin dose the night before surgery and none on the morning of surgery-NO INSULIN THE MORNING OF  SURGERY  ____ Stop Coumadin/Plavix/aspirin-N/A  ____ Stop Anti-inflammatories-NO NSAIDS OR ASPIRIN PRODUCTS-TYLENOL OK TO TAKE   ____ Stop supplements until after surgery.    ____ Bring C-Pap to the hospital.

## 2015-10-15 NOTE — Pre-Procedure Instructions (Addendum)
Value Range  LV Ejection Fraction (%) 55   Aortic Valve Regurgitation Grade none   Aortic Valve Stenosis Grade none   Mitral Valve Regurgitation Grade mild   Mitral Valve Stenosis Grade none   Tricuspid Valve Regurgitation Grade mild   Tricuspid Valve Regurgitation Max Velocity (m/s) 2.2 m/sec   Right Ventricle Systolic Pressure (mmHg) 24.5 mmHg    Result Narrative                     CARDIOLOGY DEPARTMENT                         Carolyn, Marquez                       [facility]                            [id number]                   [facility]                       Acct #: [id number]         [address] Carolyn Marquez, [place] [address]             Date: 07/15/2015 09:25 AM                                                                  Adult Female Age: 55 yrs                    ECHOCARDIOGRAM REPORT                         Outpatient           STUDY:Stress Echo        TAPE:                         KC::KCWC            ECHO:Yes   DOPPLER:Yes  FILE:0000-000-000             MD1:  KOWALSKI, BRUCE JAY           COLOR:Yes  CONTRAST:No      MACHINE:Philips       RV BIOPSY:No         3D:No   SOUND QLTY:Moderate          MEDIUM:None ___________________________________________________________________________________________                   HISTORY:DOE                   REASON:Assess, LV function               Indication:R06.02 Shortness of breath ___________________________________________________________________________________________  STRESS ECHOCARDIOGRAPHY           Protocol:Treadmill (Bruce)             Drugs:None Target Heart Rate:141 bpm        Maximum Predicted Heart Rate: 166 bpm  +-------------------+-------------------------+-------------------------+------------+  Stage            Duration (mm:ss)         Heart Rate (bpm)         BP      +-------------------+-------------------------+-------------------------+------------+       RESTING                                            68              141/84    +-------------------+-------------------------+-------------------------+------------+       EXERCISE                2:01                     151                /       +-------------------+-------------------------+-------------------------+------------+       RECOVERY                6:23                      77              153/86    +-------------------+-------------------------+-------------------------+------------+    Stress Duration:2:01 mm:ss   Max Stress H.R.:151 bpm        Target Heart Rate Achieved: Yes  ___________________________________________________________________________________________  WALL SEGMENT CHANGES                        Rest          Stress Anterior Septum Basal:Normal        Hyperkinetic                   ZOX:WRUEAV        Hyperkinetic                Apical:Normal        Hyperkinetic    Anterior Wall Basal:Normal        Hyperkinetic                   WUJ:WJXBJY        Hyperkinetic                Apical:Normal        Hyperkinetic     Lateral Wall Basal:Normal        Hyperkinetic                   NWG:NFAOZH        Hyperkinetic                Apical:Normal        Hyperkinetic   Posterior Wall Basal:Normal        Hyperkinetic                   YQM:VHQION        Hyperkinetic    Inferior Wall Basal:Normal        Hyperkinetic                   GEX:BMWUXL        Hyperkinetic                Apical:Normal  Hyperkinetic  Inferior Septum Basal:Normal        Hyperkinetic                   ZOX:WRUEAV        Hyperkinetic             Resting EF:>55% (Est.)   Stress EF: >55% (Est.)  ___________________________________________________________________________________________  ADDITIONAL FINDINGS    Chest Discomfort:None         Arrhythmia:None Termination Reason:Fatigue    Adverse Effects:None       Complications:None  ___________________________________________________________________________________________ STRESS ECG RESULTS                                               ECG Results:Normal   ___________________________________________________________________________________________ ECHOCARDIOGRAPHIC DESCRIPTIONS  LEFT VENTRICLE         Size:Normal  Contraction:Normal    LV Masses:No Masses          WUJ:WJXB Dias.FxClass:Normal  RIGHT VENTRICLE         Size:Normal               Free Wall:Normal  Contraction:Normal               RV Masses:No mass  PERICARDIUM        Fluid:No effusion   _______________________________________________________________________________________ DOPPLER ECHO and OTHER SPECIAL PROCEDURES    Aortic:No AR                      No AS     Mitral:MILD MR                    No MS           MV Inflow E Vel=nm*        MV Annulus E'Vel=nm*           E/E'Ratio=nm*  Tricuspid:MILD TR                    No TS           221.0 cm/sec peak TR vel   24.5 mmHg peak RV pressure  Pulmonary:TRIVIAL PR                 No PS    ___________________________________________________________________________________________ ECHOCARDIOGRAPHIC MEASUREMENTS 2D DIMENSIONS AORTA             Values      Normal Range      MAIN PA          Values      Normal Range           Annulus:  nm*       [2.1 - 2.5]                PA Main:  nm*       [1.5 - 2.1]         Aorta Sin:  nm*       [2.7 - 3.3]       RIGHT VENTRICLE       ST Junction:  nm*       [2.3 - 2.9]                RV Base:  nm*       [ < 4.2]         Asc.Aorta:  nm*       [  2.3 - 3.1]                 RV Mid:  nm*       [ < 3.5]  LEFT VENTRICLE                                        RV Length:  nm*       [ < 8.6]             LVIDd:  nm*       [3.9 - 5.3]       INFERIOR VENA CAVA             LVIDs:  nm*                                 Max. IVC:  nm*       [ <= 2.1]                FS:  nm*       [> 25]                     Min. IVC:  nm*               SWT:  nm*       [0.5 - 0.9]                   ------------------               PWT:  nm*       [0.5 - 0.9]                   nm* - not measured  LEFT ATRIUM           LA Diam:  nm*       [2.7 - 3.8]       LA A4C Area:  nm*       [ < 20]         LA Volume:  nm*       [22 - 52]  ___________________________________________________________________________________________  INTERPRETATION Normal Stress Echocardiogram NORMAL RIGHT VENTRICULAR SYSTOLIC FUNCTION MILD VALVULAR REGURGITATION (See above) NO VALVULAR STENOSIS NOTED   ___________________________________________________________________________________________ Electronically signed by: MD Arnoldo Hooker on 07/15/2015 10:06 AM             Performed By: Mathis Bud, RVT       Ordering Physician: Arnoldo Hooker ___________________________________________________________________________________________   Status Results Details   Encounter Summary    Communication Language Race / Ethnicity  [address] Mittie Bodo [place], [place] [address]  [phone] (Home) [phone] (Work)  English (Preferred) Cliffton Asters / Not Hispanic or Latino  Reason for Referral Incoming Referral Reason Specialty Diagnoses / Procedures Referred By Contact Referred To Contact  Procedure (Routine)    Diagnoses  Ischemic chest pain (CMS-HCC)  Procedures  Echocardiogram stress test  Carolyn Heckle, MD  [address]  [facility]  [place], [place] [address]  Phone: [phone]  Fax: [phone]      Outgoing Referral Reason Specialty Diagnoses / Procedures Referred By Contact Referred To Contact  Procedure (Routine)  Closed    Diagnoses  Ischemic chest pain (CMS-HCC)  Procedures  Echocardiogram stress test  Carolyn Heckle, MD  [address]  [facility]  [place], [place] [address]  Phone: [phone]  Fax: [phone]      Procedure (Routine)  Pending Review    Diagnoses  Ischemic chest pain (CMS-HCC)  Procedures  ECG stress test only  Carolyn Heckle, MD  [address]  [facility]  [place], [place] [address]  Phone: [phone]  Fax: [phone]     Encounter Details Date Type Department Care Team Description  07/15/2015 Appointment [facility]  [address]  [place], [place] [address]  [phone]  Carolyn Heckle, MD  [address]  [facility]  [place], [place] [address]  [phone]  [phone] (Fax)  Ischemic chest pain (CMS-HCC)  Social History - as of this encounter Tobacco Use Types Packs/Day Years Used Date  Former Smoker       Alcohol Use Drinks/Week oz/Week Comments  No 0 Standard drinks or equivalent  0.0     Birth Sex Date Recorded  Unknown   Plan of Treatment - as of this encounter Upcoming Encounters Upcoming Encounters  Date Type Specialty Care Team Description  11/09/2015 Ancillary Orders Lab Abisogun, Albin Felling, MD  [address] [facility] [address] [address] [facility] [address]  [facility]  [place], [place] [address]  [phone]  [phone] (Fax)    11/16/2015 Office Visit Endocrinology Abisogun, Albin Felling, MD  [address] [facility] [address]  [facility]  [place], [place] [address]  [phone]  [phone] (Fax)    04/06/2016 Office Visit Cardiology Carolyn Heckle, MD  [address]  [facility]  [place], [place] [address]  [phone]  [phone] (Fax)    Imaging Results - in this encounter   Echocardiogram stress test (07/15/2015 9:56 AM) Echocardiogram stress test (07/15/2015 9:56 AM)  Component Value Ref Range  LV Ejection Fraction (%) 55   Aortic Valve Regurgitation Grade none   Aortic Valve Stenosis Grade none   Mitral Valve Regurgitation Grade mild   Mitral Valve Stenosis Grade none   Tricuspid Valve Regurgitation Grade mild    Tricuspid Valve Regurgitation Max Velocity (m/s) 2.2 m/sec  Right Ventricle Systolic Pressure (mmHg) 24.5 mmHg   Echocardiogram stress test (07/15/2015 9:56 AM)  Specimen Performing Laboratory   [facility] OTHER [facility]    Echocardiogram stress test (07/15/2015 9:56 AM)  Narrative   CARDIOLOGY DEPARTMENT CLARITZA, JULY [id number]  [facility] Acct #: [id number]  [address] Carolyn Marquez, [place] [address] Date: 07/15/2015 09:25 AM   Adult Female Age: 89 yrs   ECHOCARDIOGRAM REPORT Outpatient  STUDY:Stress EchoTAPE: KC::KCWC   ECHO:Yes DOPPLER:YesFILE:0000-000-000 MD1:KOWALSKI, BRUCE JAY  COLOR:YesCONTRAST:NoMACHINE:Philips  RV BIOPSY:No 3D:No SOUND QLTY:Moderate   MEDIUM:None  ___________________________________________________________________________________________     HISTORY:DOE  REASON:Assess, LV function  Indication:R06.02 Shortness of breath  ___________________________________________________________________________________________    STRESS ECHOCARDIOGRAPHY     Protocol:Treadmill (Bruce)  Drugs:None  Target Heart Rate:141 bpmMaximum Predicted Heart Rate: 166 bpm    +-------------------+-------------------------+-------------------------+------------+   Stage  Duration (mm:ss) Heart Rate (bpm) BP   +-------------------+-------------------------+-------------------------+------------+  RESTING  68  141/84   +-------------------+-------------------------+-------------------------+------------+  EXERCISE  2:01 151  /  +-------------------+-------------------------+-------------------------+------------+  RECOVERY  6:2377  153/86   +-------------------+-------------------------+-------------------------+------------+    Stress Duration:2:01 mm:ss  Max Stress H.R.:151 bpmTarget Heart Rate Achieved: Yes    ___________________________________________________________________________________________    WALL SEGMENT CHANGES    RestStress  Anterior Septum Basal:NormalHyperkinetic  UMP:[id number]   Apical:NormalHyperkinetic    Anterior Wall Basal:NormalHyperkinetic  JKD:TOIZTIWPYKDXIPJASN   Apical:NormalHyperkinetic     Lateral Wall Basal:NormalHyperkinetic  KNL:ZJQBHALPFXTKWIOXBD   Apical:NormalHyperkinetic  Posterior Wall Basal:NormalHyperkinetic  ZOX:WRUEAVWUJWJXBJYNWGid:NormalHyperkinetic    Inferior Wall Basal:NormalHyperkinetic  NFA:OZHYQMVHQIONGEXBMWid:NormalHyperkinetic   Apical:NormalHyperkinetic    Inferior Septum Basal:NormalHyperkinetic  UXL:KGMWNUUVOZDGUYQIHKid:NormalHyperkinetic     Resting EF:>55% (Est.) Stress EF: >55% (Est.)    ___________________________________________________________________________________________    ADDITIONAL FINDINGS    Chest Discomfort:None  Arrhythmia:None  Termination Reason:Fatigue   Adverse Effects:None    Complications:None    ___________________________________________________________________________________________  STRESS ECG RESULTS     ECG Results:Normal      ___________________________________________________________________________________________  ECHOCARDIOGRAPHIC DESCRIPTIONS    LEFT VENTRICLE  Size:Normal  Contraction:Normal   LV Masses:No Masses   VQQ:VZDGLVH:None  Dias.FxClass:Normal    RIGHT VENTRICLE  Size:Normal Free Wall:Normal  Contraction:Normal RV Masses:No mass    PERICARDIUM   Fluid:No effusion      _______________________________________________________________________________________  DOPPLER ECHO and OTHER SPECIAL PROCEDURES   Aortic:No ARNo AS     Mitral:MILD MRNo MS  MV Inflow E Vel=nm*MV Annulus E'Vel=nm*  E/E'Ratio=nm*    Tricuspid:MILD TRNo TS  221.0 cm/sec peak TR vel 24.5 mmHg peak RV pressure    Pulmonary:TRIVIAL PR No PS        ___________________________________________________________________________________________  ECHOCARDIOGRAPHIC MEASUREMENTS  2D DIMENSIONS  AORTA ValuesNormal RangeMAIN PAValuesNormal Range  Annulus:nm* [2.1 - 2.5]PA Main:nm* [1.5 - 2.1]  Aorta Sin:nm* [2.7 - 3.3] RIGHT VENTRICLE  ST Junction:nm* [2.3 - 2.9]RV Base:nm* [ < 4.2]  Asc.Aorta:nm* [2.3 - 3.1] RV Mid:nm* [ < 3.5]  LEFT VENTRICLERV Length:nm* [ < 8.6]  LVIDd:nm* [3.9 - 5.3] INFERIOR VENA CAVA   LVIDs:nm* Max. IVC:nm* [ <= 2.1]   FS:nm* [> 25]Min. IVC:nm*  SWT:nm* [0.5 - 0.9] ------------------  PWT:nm* [0.5 - 0.9] nm* - not measured  LEFT ATRIUM  LA Diam:nm* [2.7 - 3.8]  LA A4C Area:nm* [ < 20]  LA Volume:nm* [22 - 52]    ___________________________________________________________________________________________    INTERPRETATION  Normal Stress Echocardiogram  NORMAL RIGHT VENTRICULAR SYSTOLIC FUNCTION  MILD VALVULAR REGURGITATION (See above)  NO VALVULAR STENOSIS NOTED      ___________________________________________________________________________________________  Electronically signed by: MD Arnoldo HookerBruce Kowalski on 07/15/2015 10:06 AM  Performed By: Carolyn Marquez, Carolyn Marquez, RDCS, RVT  Ordering Physician: Arnoldo HookerKOWALSKI, BRUCE  ___________________________________________________________________________________________    Echocardiogram stress test (07/15/2015 9:56 AM)  Procedure Note  Interface, Text Results In - 07/15/2015 10:07 AM EST  CARDIOLOGY DEPARTMENT Carolyn RaisinNEY, Carol[facility] Marquez [facility] [phone] [facility] Acct #: [id number] [address]yn [address], [place], [place] [id number] Date: 07/15/2015 09:25 AM Adult Female Age: 53 yrs ECHOCARDIOGRAM REPORT Outpatient STUDY:Stress Echo TAPE: KC::KCWC ECHO:Yes DOPPLER:Yes FILE:0000-000-000 MD1: KOWALSKI, BRUCE JAY COLOR:Yes CONTRAST:No MACHINE:Philips RV BIOPSY:No 3D:No SOUND QLTY:Moderate MEDIUM:None ___________________________________________________________________________________________  HISTORY:DOE REASON:Assess, LV function Indication:R06.02 Shortness of breath ___________________________________________________________________________________________  STRESS  ECHOCARDIOGRAPHY  Protocol:Treadmill (Bruce) Drugs:None Target Heart Rate:141 bpm Maximum Predicted Heart Rate: 166 bpm  +-------------------+-------------------------+-------------------------+------------+  Stage  Duration (mm:ss)  Heart Rate (bpm)  BP  +-------------------+-------------------------+-------------------------+------------+  RESTING   68  141/84  +-------------------+-------------------------+-------------------------+------------+  EXERCISE  2:01  151  /  +-------------------+-------------------------+-------------------------+------------+  RECOVERY  6:23  77  153/86  +-------------------+-------------------------+-------------------------+------------+  Stress Duration:2:01 mm:ss Max Stress H.R.:151 bpm Target Heart Rate Achieved: Yes  ___________________________________________________________________________________________  WALL SEGMENT CHANGES  Rest Stress Anterior Septum Basal:Normal Hyperkinetic JOA:CZYSAYid:Normal Hyperkinetic Apical:Normal Hyperkinetic  Anterior Wall Basal:Normal Hyperkinetic TKZ:SWFUXNid:Normal Hyperkinetic Apical:Normal Hyperkinetic  Lateral Wall Basal:Normal Hyperkinetic ATF:TDDUKGid:Normal Hyperkinetic Apical:Normal Hyperkinetic  Posterior Wall Basal:Normal Hyperkinetic URK:YHCWCBid:Normal Hyperkinetic  Inferior Wall Basal:Normal Hyperkinetic JSE:GBTDVVid:Normal Hyperkinetic Apical:Normal Hyperkinetic  Inferior Septum Basal:Normal Hyperkinetic OHY:WVPXTGid:Normal Hyperkinetic  Resting EF:>55% (Est.) Stress EF: >55% (Est.)  ___________________________________________________________________________________________  ADDITIONAL  FINDINGS  Chest Discomfort:None Arrhythmia:None Termination Reason:Fatigue Adverse Effects:None Complications:None  ___________________________________________________________________________________________ STRESS ECG RESULTS  ECG  Results:Normal   ___________________________________________________________________________________________ ECHOCARDIOGRAPHIC DESCRIPTIONS  LEFT VENTRICLE Size:Normal Contraction:Normal LV Masses:No Masses EAV:WUJW Dias.FxClass:Normal  RIGHT VENTRICLE Size:Normal Free Wall:Normal Contraction:Normal RV Masses:No mass  PERICARDIUM Fluid:No effusion   _______________________________________________________________________________________ DOPPLER ECHO and OTHER SPECIAL PROCEDURES Aortic:No AR No AS  Mitral:MILD MR No MS MV Inflow E Vel=nm* MV Annulus E'Vel=nm* E/E'Ratio=nm*  Tricuspid:MILD TR No TS 221.0 cm/sec peak TR vel 24.5 mmHg peak RV pressure  Pulmonary:TRIVIAL PR No PS    ___________________________________________________________________________________________ ECHOCARDIOGRAPHIC MEASUREMENTS 2D DIMENSIONS AORTA Values Normal Range MAIN PA Values Normal Range Annulus: nm* [2.1 - 2.5] PA Main: nm* [1.5 - 2.1] Aorta Sin: nm* [2.7 - 3.3] RIGHT VENTRICLE ST Junction: nm* [2.3 - 2.9] RV Base: nm* [ < 4.2] Asc.Aorta: nm* [2.3 - 3.1] RV Mid: nm* [ < 3.5] LEFT VENTRICLE RV Length: nm* [ < 8.6] LVIDd: nm* [3.9 - 5.3] INFERIOR VENA CAVA LVIDs: nm* Max. IVC: nm* [ <= 2.1] FS: nm* [> 25] Min. IVC: nm* SWT: nm* [0.5 - 0.9] ------------------ PWT: nm* [0.5 - 0.9] nm* - not measured LEFT ATRIUM LA Diam: nm* [2.7 - 3.8] LA A4C Area: nm* [ < 20] LA Volume: nm* [22 - 52]  ___________________________________________________________________________________________  INTERPRETATION Normal Stress Echocardiogram NORMAL RIGHT VENTRICULAR SYSTOLIC FUNCTION MILD VALVULAR REGURGITATION (See above) NO VALVULAR STENOSIS NOTED   ___________________________________________________________________________________________ Electronically signed by: MD Arnoldo Hooker on 07/15/2015 10:06 AM Performed By: Carolyn Murphy, RDCS, RVT Ordering Physician: Arnoldo Hooker ___________________________________________________________________________________________   EKG Results - in this encounter   ECG stress test only (07/15/2015 9:30 AM) ECG stress test only (07/15/2015 9:30 AM)  Specimen Performing Laboratory   [facility]   Visit Diagnoses - in this encounter Diagnosis  Ischemic chest pain (CMS-HCC)  Document Information Service Providers Document Coverage Dates Jan. 18, 2017 - Jan. 18, 2017 Custodian Organization [facility] [phone] (Work) [place], [place] [address] Encounter Providers KC WST CARD [place] 1 (Attending)

## 2015-10-19 ENCOUNTER — Encounter
Admission: RE | Admit: 2015-10-19 | Discharge: 2015-10-19 | Disposition: A | Discharge status: Home / Self Care | Payer: BC Managed Care – PPO | Source: Ambulatory Visit | Attending: Surgery | Admitting: Surgery

## 2015-10-19 DIAGNOSIS — E785 Hyperlipidemia, unspecified: Secondary | ICD-10-CM | POA: Diagnosis not present

## 2015-10-19 DIAGNOSIS — Z6841 Body Mass Index (BMI) 40.0 and over, adult: Secondary | ICD-10-CM | POA: Diagnosis not present

## 2015-10-19 DIAGNOSIS — D649 Anemia, unspecified: Secondary | ICD-10-CM | POA: Diagnosis not present

## 2015-10-19 DIAGNOSIS — Z833 Family history of diabetes mellitus: Secondary | ICD-10-CM | POA: Diagnosis not present

## 2015-10-19 DIAGNOSIS — Z87891 Personal history of nicotine dependence: Secondary | ICD-10-CM | POA: Diagnosis not present

## 2015-10-19 DIAGNOSIS — N63 Unspecified lump in breast: Secondary | ICD-10-CM | POA: Diagnosis present

## 2015-10-19 DIAGNOSIS — Q859 Phakomatosis, unspecified: Secondary | ICD-10-CM | POA: Diagnosis not present

## 2015-10-19 DIAGNOSIS — Z809 Family history of malignant neoplasm, unspecified: Secondary | ICD-10-CM | POA: Diagnosis not present

## 2015-10-19 DIAGNOSIS — E1140 Type 2 diabetes mellitus with diabetic neuropathy, unspecified: Secondary | ICD-10-CM | POA: Diagnosis not present

## 2015-10-19 DIAGNOSIS — K219 Gastro-esophageal reflux disease without esophagitis: Secondary | ICD-10-CM | POA: Diagnosis not present

## 2015-10-19 LAB — CBC WITH DIFFERENTIAL/PLATELET
Basophils Absolute: 0.1 10*3/uL (ref 0–0.1)
Basophils Relative: 1 %
EOS PCT: 1 %
Eosinophils Absolute: 0.1 10*3/uL (ref 0–0.7)
HCT: 37.8 % (ref 35.0–47.0)
Hemoglobin: 13.1 g/dL (ref 12.0–16.0)
LYMPHS ABS: 2.3 10*3/uL (ref 1.0–3.6)
LYMPHS PCT: 34 %
MCH: 29 pg (ref 26.0–34.0)
MCHC: 34.7 g/dL (ref 32.0–36.0)
MCV: 83.6 fL (ref 80.0–100.0)
MONO ABS: 0.4 10*3/uL (ref 0.2–0.9)
MONOS PCT: 6 %
Neutro Abs: 4 10*3/uL (ref 1.4–6.5)
Neutrophils Relative %: 58 %
PLATELETS: 230 10*3/uL (ref 150–440)
RBC: 4.52 MIL/uL (ref 3.80–5.20)
RDW: 13.8 % (ref 11.5–14.5)
WBC: 6.8 10*3/uL (ref 3.6–11.0)

## 2015-10-19 LAB — BASIC METABOLIC PANEL
Anion gap: 10 (ref 5–15)
BUN: 17 mg/dL (ref 6–20)
CHLORIDE: 103 mmol/L (ref 101–111)
CO2: 27 mmol/L (ref 22–32)
Calcium: 9.5 mg/dL (ref 8.9–10.3)
Creatinine, Ser: 0.6 mg/dL (ref 0.44–1.00)
GFR calc Af Amer: >60 mL/min (ref >60)
GFR calc non Af Amer: >60 mL/min (ref >60)
GLUCOSE: 179 mg/dL — AB (ref 65–99)
POTASSIUM: 4.2 mmol/L (ref 3.5–5.1)
Sodium: 140 mmol/L (ref 135–145)

## 2015-10-21 ENCOUNTER — Encounter: Payer: Self-pay | Admitting: *Deleted

## 2015-10-21 NOTE — Pre-Procedure Instructions (Signed)
Carolyn Marquez, Bruce Jay, MD - 07/15/2015 10:00 AM EST Formatting of this note may be different from the original. Established Patient Visit   Chief Complaint: Chief Complaint  Patient presents with  . Follow-up  stress echo today  . Shortness of Breath  Date of Service: 07/15/2015 Date of Birth: 02/08/1961 PCP: Farrel DemarkEANNA CRAVEN JONES, MD  History of Present Illness: Ms. Carolyn Marquez is a 55 y.o.female patient  Shortness of breath The patient presents with acute on chronic shortness of breath worsening with increased severity and frequency over the last 3 months which occurs with mild exertion and limits ADLs associated with climbing stairs, walking fast and exercise and relived by rest and lasting intermittent (<1 minute). Other related symptoms include fatigue. The differential diagnosis includes decrease exercise tolerance  Stress Test Exercise Stress Echo was performed showing Normal test.  Hyperlipidemia The patient does have hyperlipidemia with abnormal lipid values by blood work. We have discussed this issue and calculated their cardiovascular risk score. The patient currently does not require further intervention including medication management due to low 10 year cardiovascular risk score. We have discussed healthy lifestyle, exercise, and good eating habits with low cholesterol diet for continued improvements in the future.  Results for orders placed or performed in visit on 07/15/15  Echocardiogram stress test  Result Value Ref Range  LV Ejection Fraction (%) 55  Aortic Valve Regurgitation Grade none  Aortic Valve Stenosis Grade none  Mitral Valve Regurgitation Grade mild  Mitral Valve Stenosis Grade none  Tricuspid Valve Regurgitation Grade mild  Tricuspid Valve Regurgitation Max Velocity (m/s) 2.2 m/sec  Right Ventricle Systolic Pressure (mmHg) 24.5 mmHg  Narrative  CARDIOLOGY DEPARTMENT Francia GreavesONEY, Shanetha S [facility] [id number] [facility] Acct #: [id number] [address], [place], [place] [id number] Date: 07/15/2015 09:25 AM Adult Female Age: [age] yrs ECHOCARDIOGRAM REPORT Outpatient STUDY:Stress Echo TAPE: KC::KCWC ECHO:Yes DOPPLER:Yes FILE:[id number] MD1: KOWALSKI, BRUCE JAY COLOR:Yes CONTRAST:No MACHINE:Philips RV BIOPSY:No 3D:No SOUND QLTY:Moderate MEDIUM:None ___________________________________________________________________________________________  HISTORY:DOE REASON:Assess, LV function Indication:R06.02 Shortness of breath ___________________________________________________________________________________________  STRESS ECHOCARDIOGRAPHY  Protocol:Treadmill (Bruce) Drugs:None Target Heart Rate:141 bpm Maximum Predicted Heart Rate: 166 bpm  +-------------------+-------------------------+-------------------------+------------+  Stage  Duration (mm:ss)  Heart Rate (bpm)  BP  +-------------------+-------------------------+-------------------------+------------+  RESTING   68  141/84  +-------------------+-------------------------+-------------------------+------------+  EXERCISE  2:01  151  /  +-------------------+-------------------------+-------------------------+------------+  RECOVERY  6:23  77  153/86  +-------------------+-------------------------+-------------------------+------------+  Stress Duration:2:01 mm:ss Max Stress H.R.:151 bpm Target Heart Rate Achieved: Yes  ___________________________________________________________________________________________  WALL SEGMENT CHANGES  Rest Stress Anterior Septum Basal:Normal Hyperkinetic JWJ:XBJYNWid:Normal Hyperkinetic Apical:Normal Hyperkinetic  Anterior Wall Basal:Normal Hyperkinetic GNF:AOZHYQid:Normal Hyperkinetic Apical:Normal Hyperkinetic  Lateral Wall Basal:Normal Hyperkinetic MVH:QIONGEid:Normal Hyperkinetic Apical:Normal Hyperkinetic  Posterior Wall Basal:Normal Hyperkinetic XBM:WUXLKGid:Normal Hyperkinetic  Inferior Wall Basal:Normal Hyperkinetic MWN:UUVOZDid:Normal  Hyperkinetic Apical:Normal Hyperkinetic  Inferior Septum Basal:Normal Hyperkinetic GUY:QIHKVQid:Normal Hyperkinetic  Resting EF:>55% (Est.) Stress EF: >55% (Est.)  ___________________________________________________________________________________________  ADDITIONAL FINDINGS  Chest Discomfort:None Arrhythmia:None Termination Reason:Fatigue Adverse Effects:None Complications:None  ___________________________________________________________________________________________ STRESS ECG RESULTS  ECG Results:Normal   ___________________________________________________________________________________________ ECHOCARDIOGRAPHIC DESCRIPTIONS  LEFT VENTRICLE Size:Normal Contraction:Normal LV Masses:No Masses QVZ:DGLOLVH:None Dias.FxClass:Normal  RIGHT VENTRICLE Size:Normal Free Wall:Normal Contraction:Normal RV Masses:No mass  PERICARDIUM Fluid:No effusion   _______________________________________________________________________________________ DOPPLER ECHO and OTHER SPECIAL PROCEDURES Aortic:No AR No AS  Mitral:MILD MR No MS MV Inflow E Vel=nm* MV Annulus E'Vel=nm* E/E'Ratio=nm*  Tricuspid:MILD TR No TS 221.0 cm/sec peak TR vel 24.5 mmHg peak RV pressure  Pulmonary:TRIVIAL PR No PS    ___________________________________________________________________________________________ ECHOCARDIOGRAPHIC MEASUREMENTS 2D DIMENSIONS AORTA Values Normal Range MAIN  PA Values Normal Range Annulus: nm* [2.1 - 2.5] PA Main: nm* [1.5 - 2.1] Aorta Sin: nm* [2.7 - 3.3] RIGHT VENTRICLE ST Junction: nm* [2.3 - 2.9] RV Base: nm* [ < 4.2] Asc.Aorta: nm* [2.3 - 3.1] RV Mid: nm* [ < 3.5] LEFT VENTRICLE RV Length: nm* [ < 8.6] LVIDd: nm* [3.9 - 5.3] INFERIOR VENA CAVA LVIDs: nm* Max. IVC: nm* [ <= 2.1] FS: nm* [> 25] Min. IVC: nm* SWT: nm* [0.5 - 0.9] ------------------ PWT: nm* [0.5 - 0.9] nm* - not measured LEFT ATRIUM LA Diam: nm* [2.7 - 3.8] LA A4C Area: nm* [ < 20] LA Volume: nm* [22 -  52]  ___________________________________________________________________________________________  INTERPRETATION Normal Stress Echocardiogram NORMAL RIGHT VENTRICULAR SYSTOLIC FUNCTION MILD VALVULAR REGURGITATION (See above) NO VALVULAR STENOSIS NOTED   ___________________________________________________________________________________________ Electronically signed by: MD Arnoldo Hooker on 07/15/2015 10:06 AM Performed By: Luretha Murphy, RDCS, RVT Ordering Physician: Arnoldo Hooker ___________________________________________________________________________________________   Past Medical and Surgical History  Past Medical History Past Medical History  Diagnosis Date  . Diabetes mellitus type 2, uncomplicated (HCC)  . Migraine headache  . Neuropathy [facility])   Past Surgical History She has no past surgical history on file.   Medications and Allergies  Current Medications  Current Outpatient Prescriptions  Medication Sig Dispense Refill  . gabapentin (NEURONTIN) 300 MG capsule Take 300 mg by mouth 3 (three) times daily.  . metFORMIN (GLUCOPHAGE) 500 MG tablet Take 1 tablet by mouth 2 (two) times daily.  Marland Kitchen omeprazole (PRILOSEC) 20 MG DR capsule Take 1 capsule (20 mg total) by mouth once daily. 30 capsule 11  . VICTOZA 2-PAK 0.6 mg/0.1 mL (18 mg/3 mL) pen injector   No current facility-administered medications for this visit.   Allergies: Review of patient's allergies indicates no known allergies.  Social and Family History  Social History reports that she has quit smoking. She does not have any smokeless tobacco history on file. She reports that she does not drink alcohol or use illicit drugs.  Family History Family History  Problem Relation Age of Onset  . No Known Problems Mother  . Diabetes mellitus Sister  . No Known Problems Brother  . No Known Problems Daughter  . No Known Problems Son   Review of Systems   Review of Systems  Positive for sob  Negative for  weight gain weight loss, weakness, vision change, hearing loss, cough, congestion, PND, orthopnea, heartburn, nausea, diaphoresis, vomiting, diarrhea, bloody stool, melena, stomach pain, extremity pain, leg weakness, leg cramping, leg blood clots, headache, blackouts, nosebleed, trouble swallowing, mouth pain, urinary frequency, urination at night, muscle weakness, skin lesions, skin rashes, tingling ,ulcers, numbness, anxiety,  Physical Examination   Vitals: Visit Vitals  . BP 141/84  . Pulse 68  . Ht 157.5 cm ( )  . Wt (!) 114.8 kg (253 lb)  . BMI 46.27 kg/m2   Ht:157.5 cm ( ) Wt:(!) 114.8 kg (253 lb) ZOX:WRUE surface area is 2.24 meters squared. Body mass index is 46.27 kg/(m^2). Appearance: well appearing in no acute distress HEENT: Pupils equally reactive to light and accomodation, no xanthalasma  Neck: Supple, no apparent thyromegaly, masses, or lymphadenopathy  Lungs: normal respiratory effort; no crackles, no rhonchi, no wheezes Heart: Regular rate and rhythm. Normal S1 S2 No gallops, murmur, no rub, PMI is normal size and placement. carotid upstroke normal without bruit. Jugular venous pressure is normal Abdomen: soft, nontender, distended with normal bowel sounds. No apparent hepatosplenomegally. Abdominal aorta is normal size without bruit Extremities:  no edema, no clubbing, no cyanosis Peripheral Pulses: 2+ in upper extremities, 2+ femoral pulses bilaterally, 2+lower extremity  Musculoskeletal; Normal muscle tone without kyphosis Neurological: Cranial nerves intact, Oriented and Alert  Assessment   55 y.o. female with  Encounter Diagnoses  Name Primary?  . Shortness of breath Yes  . Mixed hyperlipidemia   Plan  -We have discussed a diet and lifestyle program to improve quality of life measures and cardiovascular risk reduction. This includes a diet rich in fruits, vegetables, fiber, and nuts with lower amounts of saturated fats. The patient is advised to begin  this activity and watch for improvements over the next few months. -No further cardiovascular intervention and/or medication additions due to only minimal abnormality of cholesterol levels and low 10 year cardiovascular risk score. The patient will continue healthy lifestyle measures to achieve appropriate cardiovascular risk reduction. -No further cardiac intervention at this time due to normal stress test without evidence of myocardial ischemia or chest pain at peak stress. Patient will watch for any recurrance of concerning symptoms. -No further intervention shortness of breath reported above multifactorial in nature including age, decreased exercise tolerance, disability, and/or medication management. The patient is to follow for any worsening symptoms or increase in severity for further in need in investigation or treatment options.   No orders of the defined types were placed in this encounter.  Return in about 9 months (around 04/13/2016).  Carolyn Heckle, MD    Plan of Treatment - as of this encounter Upcoming Encounters Upcoming Encounters  Date Type Specialty Care Team Description  07/27/2015 Appointment Endocrinology Abisogun, Albin Felling, MD  [address] [facility] [address]  [facility]  [place], [place] [address]  [phone]  [phone] (Fax)    Visit Diagnoses - in this encounter Diagnosis  Shortness of breath - Primary  Mixed hyperlipidemia  Document Information Primary Care Provider Farrel Demark (Sep. 30, 2015 - Present) [phone] (Work) [phone] (Fax)  [address] [facility] [place], [place] [address] Document Coverage Dates Jan. 18, 2017 - Jan. 18, 2017 Custodian Organization [facility] [phone] (Work) [place], [place] [address] Encounter Providers Bruce Maureen Ralphs (Attending) [phone] (Work) [phone] (Fax)  [address] [facility] [place], [place] [address]

## 2015-10-22 ENCOUNTER — Telehealth: Payer: Self-pay | Admitting: Surgery

## 2015-10-22 ENCOUNTER — Ambulatory Visit
Admission: RE | Admit: 2015-10-22 | Discharge: 2015-10-22 | Disposition: A | Discharge status: Home / Self Care | Payer: BC Managed Care – PPO | Source: Ambulatory Visit | Attending: Surgery | Admitting: Surgery

## 2015-10-22 ENCOUNTER — Ambulatory Visit: Payer: BC Managed Care – PPO | Admitting: Anesthesiology

## 2015-10-22 ENCOUNTER — Encounter: Payer: Self-pay | Admitting: *Deleted

## 2015-10-22 ENCOUNTER — Encounter
Admission: RE | Disposition: A | Discharge status: Home / Self Care | Payer: Self-pay | Source: Ambulatory Visit | Attending: Surgery

## 2015-10-22 DIAGNOSIS — Z833 Family history of diabetes mellitus: Secondary | ICD-10-CM | POA: Insufficient documentation

## 2015-10-22 DIAGNOSIS — Z809 Family history of malignant neoplasm, unspecified: Secondary | ICD-10-CM | POA: Insufficient documentation

## 2015-10-22 DIAGNOSIS — E785 Hyperlipidemia, unspecified: Secondary | ICD-10-CM | POA: Insufficient documentation

## 2015-10-22 DIAGNOSIS — N63 Unspecified lump in unspecified breast: Secondary | ICD-10-CM | POA: Insufficient documentation

## 2015-10-22 DIAGNOSIS — Z6841 Body Mass Index (BMI) 40.0 and over, adult: Secondary | ICD-10-CM | POA: Insufficient documentation

## 2015-10-22 DIAGNOSIS — E1140 Type 2 diabetes mellitus with diabetic neuropathy, unspecified: Secondary | ICD-10-CM | POA: Insufficient documentation

## 2015-10-22 DIAGNOSIS — Z87891 Personal history of nicotine dependence: Secondary | ICD-10-CM | POA: Insufficient documentation

## 2015-10-22 DIAGNOSIS — K219 Gastro-esophageal reflux disease without esophagitis: Secondary | ICD-10-CM | POA: Insufficient documentation

## 2015-10-22 DIAGNOSIS — Q859 Phakomatosis, unspecified: Secondary | ICD-10-CM | POA: Insufficient documentation

## 2015-10-22 DIAGNOSIS — D649 Anemia, unspecified: Secondary | ICD-10-CM | POA: Insufficient documentation

## 2015-10-22 HISTORY — DX: Other complications of anesthesia, initial encounter: T88.59XA

## 2015-10-22 HISTORY — DX: Adverse effect of unspecified anesthetic, initial encounter: T41.45XA

## 2015-10-22 HISTORY — PX: BREAST BIOPSY: SHX20

## 2015-10-22 HISTORY — DX: Anxiety disorder, unspecified: F41.9

## 2015-10-22 HISTORY — DX: Bronchitis, not specified as acute or chronic: J40

## 2015-10-22 HISTORY — DX: Headache, unspecified: R51.9

## 2015-10-22 HISTORY — DX: Headache: R51

## 2015-10-22 HISTORY — DX: Anemia, unspecified: D64.9

## 2015-10-22 LAB — GLUCOSE, CAPILLARY
Glucose-Capillary: 160 mg/dL — ABNORMAL HIGH (ref 65–99)
Glucose-Capillary: 121 mg/dL — ABNORMAL HIGH (ref 65–99)

## 2015-10-22 LAB — POCT PREGNANCY, URINE: PREG TEST UR: NEGATIVE

## 2015-10-22 SURGERY — BREAST BIOPSY
Anesthesia: General | Site: Breast | Laterality: Left | Wound class: Clean

## 2015-10-22 MED ORDER — HYDROMORPHONE HCL 1 MG/ML IJ SOLN
INTRAMUSCULAR | Status: AC
  Filled 2015-10-22: qty 1

## 2015-10-22 MED ORDER — FAMOTIDINE 20 MG PO TABS
20.0000 mg | ORAL_TABLET | Freq: Once | ORAL | Status: AC
  Administered 2015-10-22: 20 mg via ORAL

## 2015-10-22 MED ORDER — FAMOTIDINE 20 MG PO TABS
ORAL_TABLET | ORAL | Status: AC
Start: 2015-10-22 — End: 2015-10-22
  Administered 2015-10-22: 20 mg via ORAL
  Filled 2015-10-22: qty 1

## 2015-10-22 MED ORDER — KETOROLAC TROMETHAMINE 30 MG/ML IJ SOLN
INTRAMUSCULAR | Status: DC | PRN
Start: 2015-10-22 — End: 2015-10-22
  Administered 2015-10-22: 30 mg via INTRAVENOUS

## 2015-10-22 MED ORDER — ONDANSETRON HCL 4 MG/2ML IJ SOLN
INTRAMUSCULAR | Status: DC | PRN
  Administered 2015-10-22: 4 mg via INTRAVENOUS

## 2015-10-22 MED ORDER — HYDROMORPHONE HCL 1 MG/ML IJ SOLN
INTRAMUSCULAR | Status: AC
  Administered 2015-10-22: 0.5 mg
  Filled 2015-10-22: qty 1

## 2015-10-22 MED ORDER — HYDROMORPHONE HCL 1 MG/ML IJ SOLN
0.5000 mg | INTRAMUSCULAR | Status: DC | PRN
  Administered 2015-10-22 (×3): 0.5 mg via INTRAVENOUS

## 2015-10-22 MED ORDER — LIDOCAINE HCL (CARDIAC) 20 MG/ML IV SOLN
INTRAVENOUS | Status: DC | PRN
  Administered 2015-10-22: 100 mg via INTRAVENOUS

## 2015-10-22 MED ORDER — NON FORMULARY: 12.5000 mg | Freq: Once | Status: DC

## 2015-10-22 MED ORDER — BUPIVACAINE-EPINEPHRINE (PF) 0.25% -1:200000 IJ SOLN
INTRAMUSCULAR | Status: DC | PRN
  Administered 2015-10-22: 30 mL via PERINEURAL

## 2015-10-22 MED ORDER — HEPARIN SODIUM (PORCINE) 5000 UNIT/ML IJ SOLN
5000.0000 [IU] | Freq: Once | INTRAMUSCULAR | Status: AC
  Administered 2015-10-22: 5000 [IU] via SUBCUTANEOUS

## 2015-10-22 MED ORDER — FENTANYL CITRATE (PF) 100 MCG/2ML IJ SOLN
INTRAMUSCULAR | Status: DC | PRN
  Administered 2015-10-22 (×2): 25 ug via INTRAVENOUS
  Administered 2015-10-22: 50 ug via INTRAVENOUS

## 2015-10-22 MED ORDER — OXYCODONE HCL 5 MG PO TABS
ORAL_TABLET | ORAL | Status: AC
  Administered 2015-10-22: 5 mg
  Filled 2015-10-22: qty 1

## 2015-10-22 MED ORDER — SODIUM CHLORIDE 0.9 % IV SOLN
INTRAVENOUS | Status: DC
  Administered 2015-10-22: 10:00:00 via INTRAVENOUS

## 2015-10-22 MED ORDER — PROMETHAZINE HCL 25 MG/ML IJ SOLN
12.5000 mg | Freq: Once | INTRAMUSCULAR | Status: AC
  Administered 2015-10-22: 12.5 mg via INTRAVENOUS
  Filled 2015-10-22: qty 1

## 2015-10-22 MED ORDER — FENTANYL CITRATE (PF) 100 MCG/2ML IJ SOLN
25.0000 ug | INTRAMUSCULAR | Status: AC | PRN
  Administered 2015-10-22 (×6): 25 ug via INTRAVENOUS

## 2015-10-22 MED ORDER — HEPARIN SODIUM (PORCINE) 5000 UNIT/ML IJ SOLN
INTRAMUSCULAR | Status: AC
  Administered 2015-10-22: 5000 [IU] via SUBCUTANEOUS
  Filled 2015-10-22: qty 1

## 2015-10-22 MED ORDER — MIDAZOLAM HCL 2 MG/2ML IJ SOLN
INTRAMUSCULAR | Status: DC | PRN
  Administered 2015-10-22: 2 mg via INTRAVENOUS

## 2015-10-22 MED ORDER — BUPIVACAINE-EPINEPHRINE (PF) 0.25% -1:200000 IJ SOLN
INTRAMUSCULAR | Status: AC
  Filled 2015-10-22: qty 30

## 2015-10-22 MED ORDER — ONDANSETRON HCL 4 MG/2ML IJ SOLN
INTRAMUSCULAR | Status: AC
  Filled 2015-10-22: qty 2

## 2015-10-22 MED ORDER — FENTANYL CITRATE (PF) 100 MCG/2ML IJ SOLN
INTRAMUSCULAR | Status: AC
  Filled 2015-10-22: qty 2

## 2015-10-22 MED ORDER — PROPOFOL 10 MG/ML IV BOLUS
INTRAVENOUS | Status: DC | PRN
  Administered 2015-10-22: 60 mg via INTRAVENOUS
  Administered 2015-10-22: 140 mg via INTRAVENOUS

## 2015-10-22 MED ORDER — ONDANSETRON HCL 4 MG/2ML IJ SOLN
4.0000 mg | Freq: Once | INTRAMUSCULAR | Status: AC | PRN
  Administered 2015-10-22: 4 mg via INTRAVENOUS

## 2015-10-22 MED ORDER — HYDROCODONE-ACETAMINOPHEN 5-300 MG PO TABS: 1.0000 | ORAL_TABLET | ORAL | Status: DC | PRN

## 2015-10-22 SURGICAL SUPPLY — 29 items
ADHESIVE MASTISOL STRL (MISCELLANEOUS) ×3 IMPLANT
BLADE SURG 15 STRL LF DISP TIS (BLADE) ×1 IMPLANT
BLADE SURG 15 STRL SS (BLADE) ×2
CANISTER SUCT 1200ML W/VALVE (MISCELLANEOUS) ×3 IMPLANT
CHLORAPREP W/TINT 26ML (MISCELLANEOUS) ×3 IMPLANT
CLOSURE WOUND 1/2 X4 (GAUZE/BANDAGES/DRESSINGS) ×1
CNTNR SPEC 2.5X3XGRAD LEK (MISCELLANEOUS) ×1
CONT SPEC 4OZ STER OR WHT (MISCELLANEOUS) ×2
CONTAINER SPEC 2.5X3XGRAD LEK (MISCELLANEOUS) ×1 IMPLANT
DEVICE DUBIN SPECIMEN MAMMOGRA (MISCELLANEOUS) ×3 IMPLANT
DRAPE LAPAROTOMY TRNSV 106X77 (MISCELLANEOUS) ×3 IMPLANT
ELECT REM PT RETURN 9FT ADLT (ELECTROSURGICAL) ×3
ELECTRODE REM PT RTRN 9FT ADLT (ELECTROSURGICAL) ×1 IMPLANT
GAUZE FLUFF 18X24 1PLY STRL (GAUZE/BANDAGES/DRESSINGS) ×3 IMPLANT
GLOVE BIO SURGEON STRL SZ8 (GLOVE) ×6 IMPLANT
GOWN STRL REUS W/ TWL LRG LVL3 (GOWN DISPOSABLE) ×2 IMPLANT
GOWN STRL REUS W/TWL LRG LVL3 (GOWN DISPOSABLE) ×4
KIT RM TURNOVER STRD PROC AR (KITS) ×3 IMPLANT
LABEL OR SOLS (LABEL) ×3 IMPLANT
NDL SAFETY 22GX1.5 (NEEDLE) ×3 IMPLANT
PACK BASIN MINOR ARMC (MISCELLANEOUS) ×3 IMPLANT
SPONGE LAP 18X18 5 PK (GAUZE/BANDAGES/DRESSINGS) ×3 IMPLANT
STRIP CLOSURE SKIN 1/2X4 (GAUZE/BANDAGES/DRESSINGS) ×2 IMPLANT
SUT MNCRL AB 4-0 PS2 18 (SUTURE) ×3 IMPLANT
SUT VIC AB 3-0 SH 27 (SUTURE) ×2
SUT VIC AB 3-0 SH 27X BRD (SUTURE) ×1 IMPLANT
SYRINGE 10CC LL (SYRINGE) ×3 IMPLANT
TAPE MICROFOAM 4IN (TAPE) ×3 IMPLANT
WATER STERILE IRR 1000ML POUR (IV SOLUTION) ×3 IMPLANT

## 2015-10-22 NOTE — Anesthesia Preprocedure Evaluation (Addendum)
Anesthesia Evaluation  Patient identified by MRN, date of birth, ID band Patient awake    Reviewed: Allergy & Precautions, NPO status , Patient's Chart, lab work & pertinent test results, reviewed documented beta blocker date and time   History of Anesthesia Complications (+) history of anesthetic complications  Airway Mallampati: II  TM Distance: >3 FB     Dental  (+) Chipped, Missing, Caps   Pulmonary neg shortness of breath, sleep apnea (likely based on history) , neg COPD, neg recent URI, former smoker,           Cardiovascular Exercise Tolerance: Good negative cardio ROS       Neuro/Psych  Headaches, neg Seizures Anxiety  Neuromuscular disease (peripheral neuropathy)    GI/Hepatic Neg liver ROS, GERD  Controlled,  Endo/Other  diabetes, Type 2, Oral Hypoglycemic Agents, Insulin DependentMorbid obesity  Renal/GU negative Renal ROS  negative genitourinary   Musculoskeletal   Abdominal   Peds  Hematology negative hematology ROS (+)   Anesthesia Other Findings Past Medical History:   Diabetes mellitus without complication (HCC)                 Diabetic neuropathy (HCC)                                    Breast mass                                                    Comment:5 o'clock left present @ 1 month "quarter               sized"   Hyperlipidemia                                               Bronchitis                                                   Anxiety                                                      Headache                                                       Comment:MIGRAINES   Anemia                                                       GERD (gastroesophageal reflux disease)  Comment:H/O -NO CURRENT PROBLEMS   Complication of anesthesia                                     Comment:PT GETS VERY ANXIOUS COMING OUT OF ANESTHESIA               AND FEELS LIKE SHE CANT  BREATHE   Reproductive/Obstetrics negative OB ROS                            Anesthesia Physical Anesthesia Plan  ASA: III  Anesthesia Plan: General   Post-op Pain Management:    Induction: Intravenous  Airway Management Planned: LMA  Additional Equipment:   Intra-op Plan:   Post-operative Plan:   Informed Consent: I have reviewed the patients History and Physical, chart, labs and discussed the procedure including the risks, benefits and alternatives for the proposed anesthesia with the patient or authorized representative who has indicated his/her understanding and acceptance.     Plan Discussed with: CRNA, Anesthesiologist and Surgeon  Anesthesia Plan Comments:        Anesthesia Quick Evaluation

## 2015-10-22 NOTE — Telephone Encounter (Signed)
Dr. Excell Seltzerooper told her she could go back to work Monday but nurse said no lifting. Please advise patient. Patient does a lot of lifting at her job. She had a breast biopsy today

## 2015-10-22 NOTE — Progress Notes (Signed)
Preoperative Review   Patient is met in the preoperative holding area. The history is reviewed in the chart and with the patient. I personally reviewed the options and rationale as well as the risks of this procedure that have been previously discussed with the patient. All questions asked by the patient and/or family were answered to their satisfaction.  Patient examined and marked on the palpable mass  Patient agrees to proceed with this procedure at this time.  Florene Glen M.D. FACS

## 2015-10-22 NOTE — Anesthesia Procedure Notes (Signed)
Procedure Name: LMA Insertion Performed by: Chanc Kervin Pre-anesthesia Checklist: Patient identified, Emergency Drugs available, Suction available, Patient being monitored and Timeout performed Patient Re-evaluated:Patient Re-evaluated prior to inductionOxygen Delivery Method: Circle system utilized Preoxygenation: Pre-oxygenation with 100% oxygen Intubation Type: IV induction LMA: LMA inserted LMA Size: 3.0 Number of attempts: 1 Tube secured with: Tape Dental Injury: Teeth and Oropharynx as per pre-operative assessment      

## 2015-10-22 NOTE — Anesthesia Postprocedure Evaluation (Signed)
Anesthesia Post Note  Patient: Athena Masseina M Lemon  Procedure(s) Performed: Procedure(s) (LRB): BREAST BIOPSY, left (Left)  Patient location during evaluation: PACU Anesthesia Type: General Level of consciousness: awake and alert Pain management: pain level controlled Vital Signs Assessment: post-procedure vital signs reviewed and stable Respiratory status: spontaneous breathing, nonlabored ventilation, respiratory function stable and patient connected to nasal cannula oxygen Cardiovascular status: blood pressure returned to baseline and stable Postop Assessment: no signs of nausea or vomiting Anesthetic complications: no    Last Vitals:  Filed Vitals:   10/22/15 1420 10/22/15 1425  BP:    Pulse: 65 46  Temp:    Resp: 16 12    Last Pain:  Filed Vitals:   10/22/15 1425  PainSc: 5                  Lenard SimmerAndrew Keith Cancio

## 2015-10-22 NOTE — Transfer of Care (Signed)
Immediate Anesthesia Transfer of Care Note  Patient: Carolyn Marquez  Procedure(s) Performed: Procedure(s): BREAST BIOPSY, left (Left)  Patient Location: PACU  Anesthesia Type:General  Level of Consciousness: awake and responds to stimulation  Airway & Oxygen Therapy: Patient Spontanous Breathing and Patient connected to face mask oxygen  Post-op Assessment: Report given to RN and Post -op Vital signs reviewed and stable  Post vital signs: Reviewed and stable  Last Vitals:  Filed Vitals:   10/22/15 1000 10/22/15 1314  BP: 117/68 124/71  Pulse: 69   Temp: 36.7 C   Resp: 16 15    Last Pain: There were no vitals filed for this visit.       Complications: No apparent anesthesia complications

## 2015-10-22 NOTE — Discharge Instructions (Signed)

## 2015-10-22 NOTE — Op Note (Signed)
10/22/2015  1:29 PM  PATIENT:  Carolyn Marquez  55 y.o. female  PRE-OPERATIVE DIAGNOSIS: Left breast mass  POST-OPERATIVE DIAGNOSIS:  Same  PROCEDURE: Breast biopsy  SURGEON:  Lattie Hawichard E Key Cen MD, FACS   ANESTHESIA:  Gen. 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 Preoperative discussed rationale for surgery the options of observation risk bleeding infection recurrence hematoma seroma and cosmetic deformity this is all reviewed for she and her family in the preop holding area the understood and agreed to proceed.  Patient was induced to general anesthesia prepped draped sterile fashion surgical timeout was held and then the previously marked and palpable mass was identified local anesthetic was infiltrated into the skin and subcutaneous tissues tissues around the periareolar area and incision was made dissection down to the mass was performed it was elevated and found to be a well-circumscribed rubbery mass suggestive of fibroadenoma. Additional Marcaine was placed and hemostasis was maintained with electrocautery. The sponge lap needle count was correct a single vessel that would not respond electrocautery was suture ligated with 3-0 Vicryl figure-of-eight sutures. Again hemostasis was checked and found to be adequate sponge lap needle count was correct therefore the wound was closed with deep sutures of 3-0 Vicryl followed by 4-0 subcuticular Monocryl. Steritapes Mastisol and sterile dressings were placed  Patient had the procedure well the workup occasions she was taken the recovery room in stable condition to be discharged care family and follow-up in 10 days   Lattie Hawichard E Darshay Deupree, MD FACS

## 2015-10-23 ENCOUNTER — Telehealth: Payer: Self-pay | Admitting: Surgery

## 2015-10-23 LAB — SURGICAL PATHOLOGY

## 2015-10-23 NOTE — Telephone Encounter (Signed)
Returned phone call at this time. Patient states that she may not return to work on a lifting restriction. Explained to the patient that we will send a note to her employer and she will be evaluated at her 10/30/15 appointment with Dr. Excell Seltzerooper on further lifting restrictions.  Note written and faxed to EM Aubery LappingYoder at this time. Fax [phone].

## 2015-10-23 NOTE — Telephone Encounter (Signed)
Patient would like to be called back to discuss restrictions for work.

## 2015-10-26 NOTE — Telephone Encounter (Signed)
Patient called this morning stating that Amber was suppose to speak with Dr. Excell Seltzerooper in reference to lifting her restrictions. I told her that Amber was off today and that Dr. Excell Seltzerooper was off today and tomorrow. I told patient that unfortunately she would have to wait until we have spoken with Dr. Excell Seltzerooper to ask him about lifting restrictions. I also told her that once I spoke to Dr. Excell Seltzerooper that I would call her back and let her know what he said. Patient understood.

## 2015-10-26 NOTE — Telephone Encounter (Signed)
Patient left a voice message that the doctor said she could go back to work today and has question regarding lifting. Please call at [phone]

## 2015-10-27 NOTE — Telephone Encounter (Signed)
Will need to speak with Dr. Excell Seltzerooper regarding this information prior to releasing to return to work.

## 2015-10-28 ENCOUNTER — Telehealth: Payer: Self-pay | Admitting: Surgery

## 2015-10-28 NOTE — Telephone Encounter (Signed)
Please see other encounter.

## 2015-10-28 NOTE — Telephone Encounter (Signed)
Spoke with Dr. Excell Seltzerooper regarding this patient. He would like patient to go back to work without restrictions.  Called and explained this to patient. Note faxed to patient's employer at this time.

## 2015-10-28 NOTE — Telephone Encounter (Signed)
Patient has called and would like to know if the nurse has heard anything back from Dr Excell Seltzerooper about her returning to work. Please call patient at number verified in chart.

## 2015-10-30 ENCOUNTER — Encounter: Payer: Self-pay | Admitting: Surgery

## 2015-10-30 ENCOUNTER — Ambulatory Visit (INDEPENDENT_AMBULATORY_CARE_PROVIDER_SITE_OTHER): Payer: BC Managed Care – PPO | Admitting: Surgery

## 2015-10-30 VITALS — BP 124/84 | HR 87 | Temp 97.8°F | Ht 62.0 in | Wt 243.0 lb

## 2015-10-30 DIAGNOSIS — R928 Other abnormal and inconclusive findings on diagnostic imaging of breast: Secondary | ICD-10-CM

## 2015-10-30 NOTE — Progress Notes (Signed)
Outpatient postop visit  10/30/2015  Carolyn Marquez is an 55 y.o. female.    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    Physical Exam:  BP 124/84 mmHg  Pulse 87  Temp(Src) 97.8 F (36.6 C) (Oral)  Ht 5\' 2"  (1.575 m)  Wt 243 lb (110.224 kg)  BMI 44.43 kg/m2  LMP 06/16/2015    PE wound is clean no erythema no drainage nontender    Assessment/Plan:  Pathology is reviewed with the patient as I had called her on the telephone to discuss this with her previously. Currently the wound is healing well without problems she will follow-up with us in 6 months for a left mammogram to do delineate an new baseline for her mammography.  Lattie Hawichard E Cooper, MD, FACS

## 2015-10-30 NOTE — Patient Instructions (Signed)
Please apply Mederma or Vitamin E on your incision to help with the scar.  Take other-the-counter Miralax, 17 G daily to help you with constipation.

## 2016-05-02 ENCOUNTER — Telehealth: Payer: Self-pay

## 2016-05-02 DIAGNOSIS — N6320 Unspecified lump in the left breast, unspecified quadrant: Secondary | ICD-10-CM

## 2016-05-04 NOTE — Telephone Encounter (Signed)
[facility] Then patient will be seen by Dr. Excell Seltzerooper on 06/06/2016 to go over her results. Appointment reminders were sent via mail since patient did not answer when I called her.

## 2016-05-31 ENCOUNTER — Other Ambulatory Visit: Payer: BC Managed Care – PPO

## 2016-05-31 ENCOUNTER — Ambulatory Visit: Payer: BC Managed Care – PPO

## 2016-06-06 ENCOUNTER — Ambulatory Visit: Payer: Self-pay | Admitting: Surgery

## 2016-09-04 IMAGING — US US BREAST*L* LIMITED INC AXILLA
1 series · 6 of 6 positions shown · non-contrast
Comparison: Previous exam(s).

CLINICAL DATA: Palpable lump left breast

EXAM:
2D DIGITAL DIAGNOSTIC BILATERAL MAMMOGRAM WITH CAD AND ADJUNCT TOMO
ULTRASOUND LEFT BREAST

[Series 1: us breast*left* limited inc axilla · 0.07mm/px · 6 of 6 slices shown]
[im 1/6]
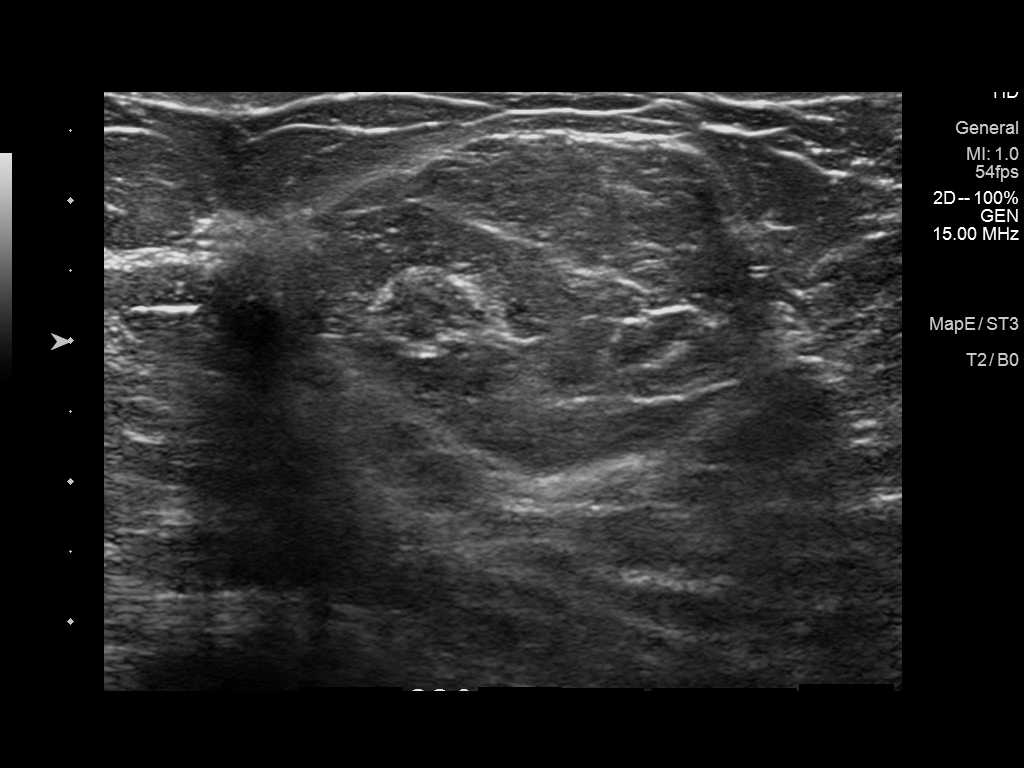
[im 2/6]
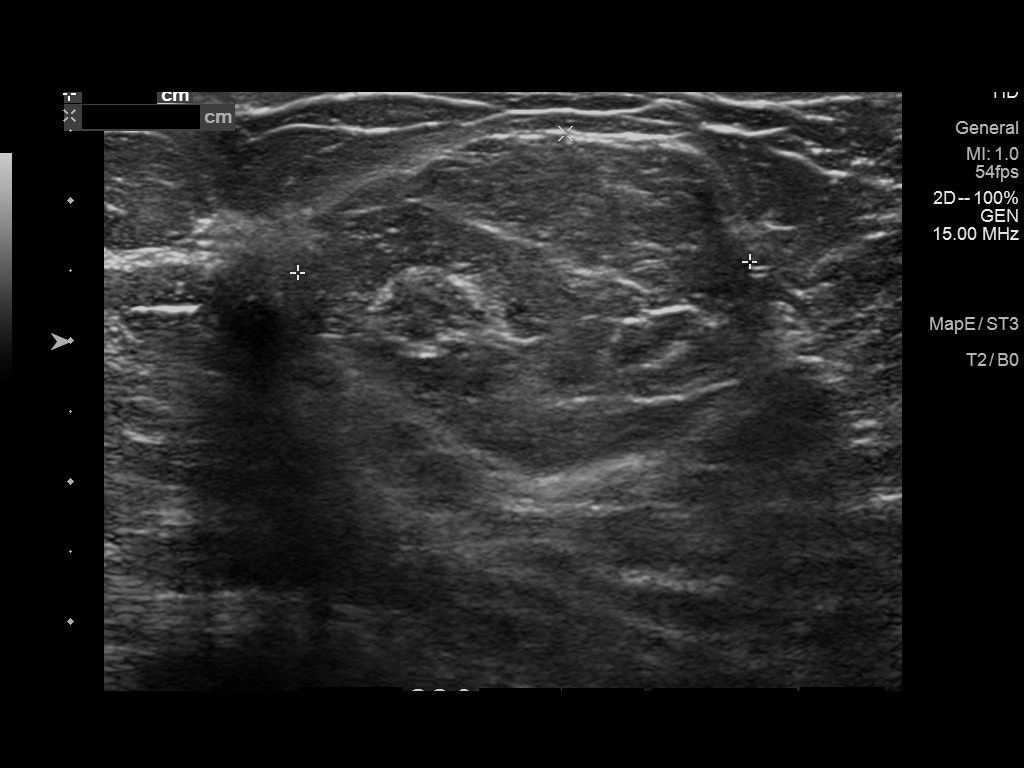
[im 3/6]
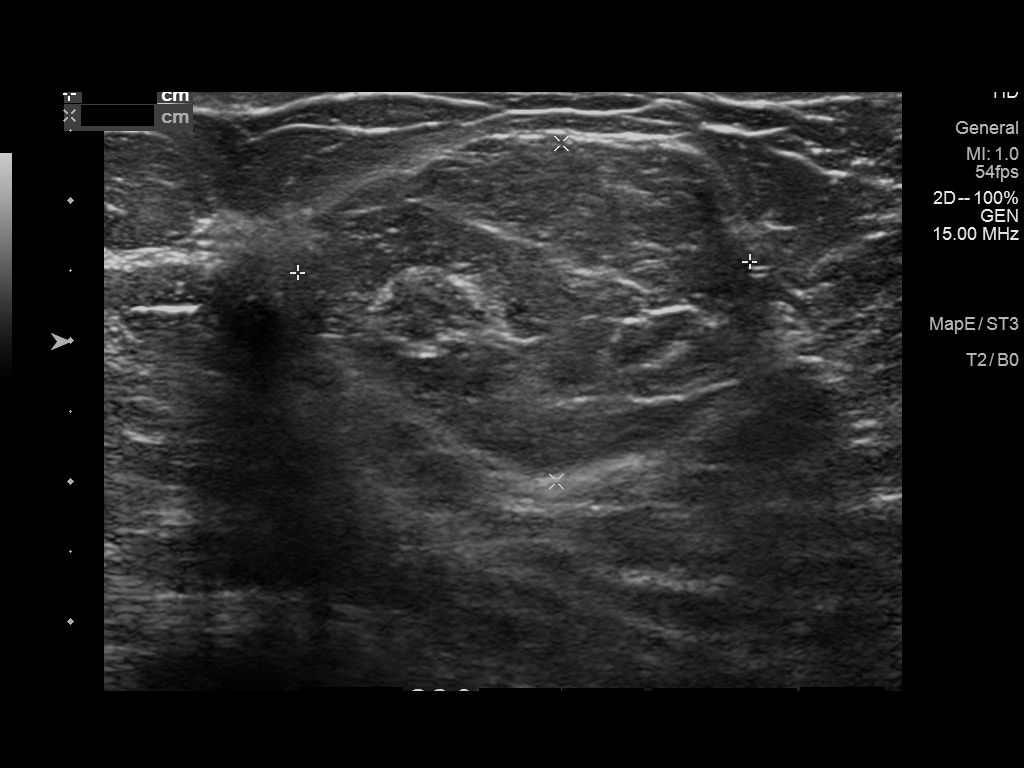
[im 4/6]
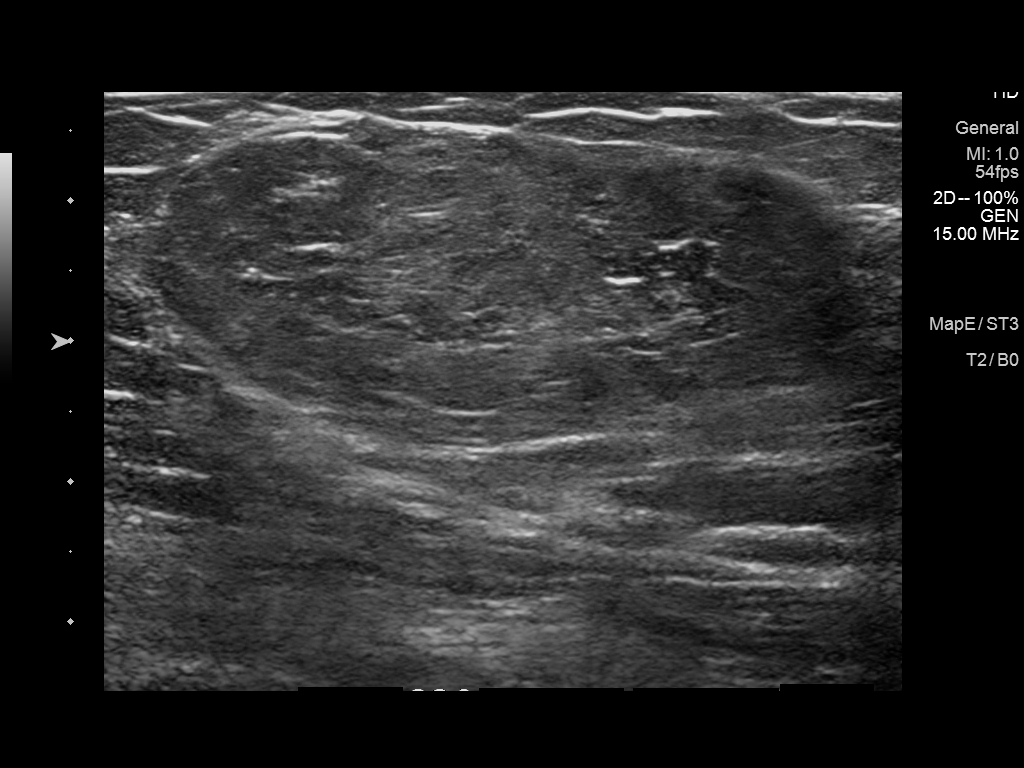
[im 5/6]
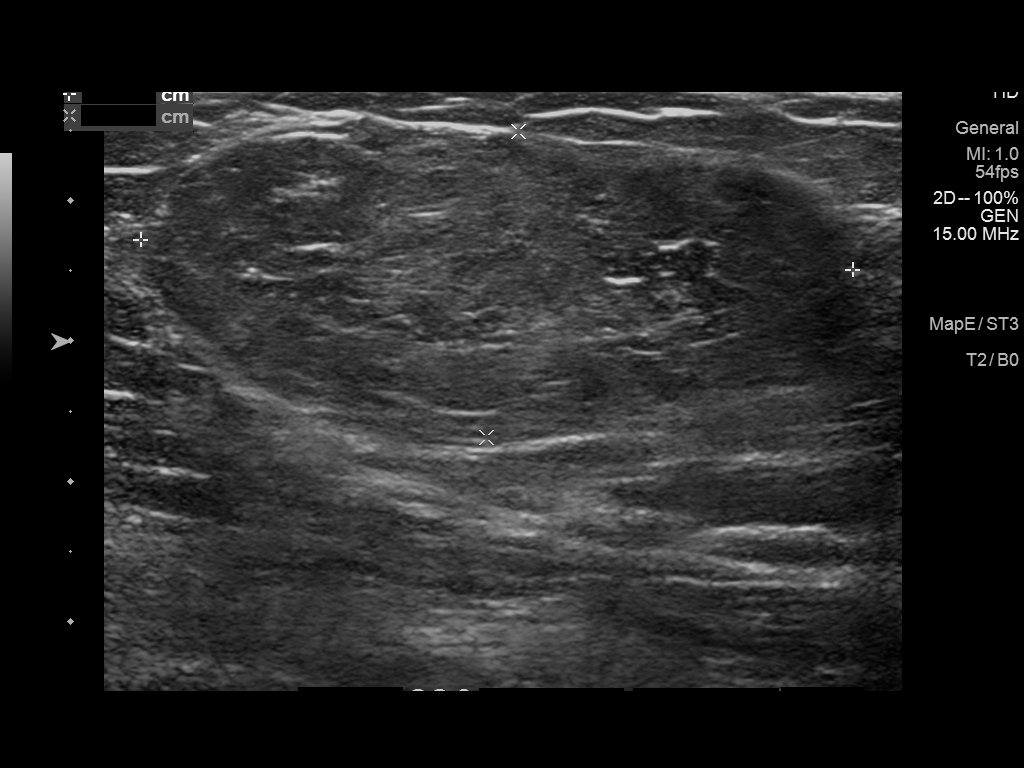
[im 6/6]
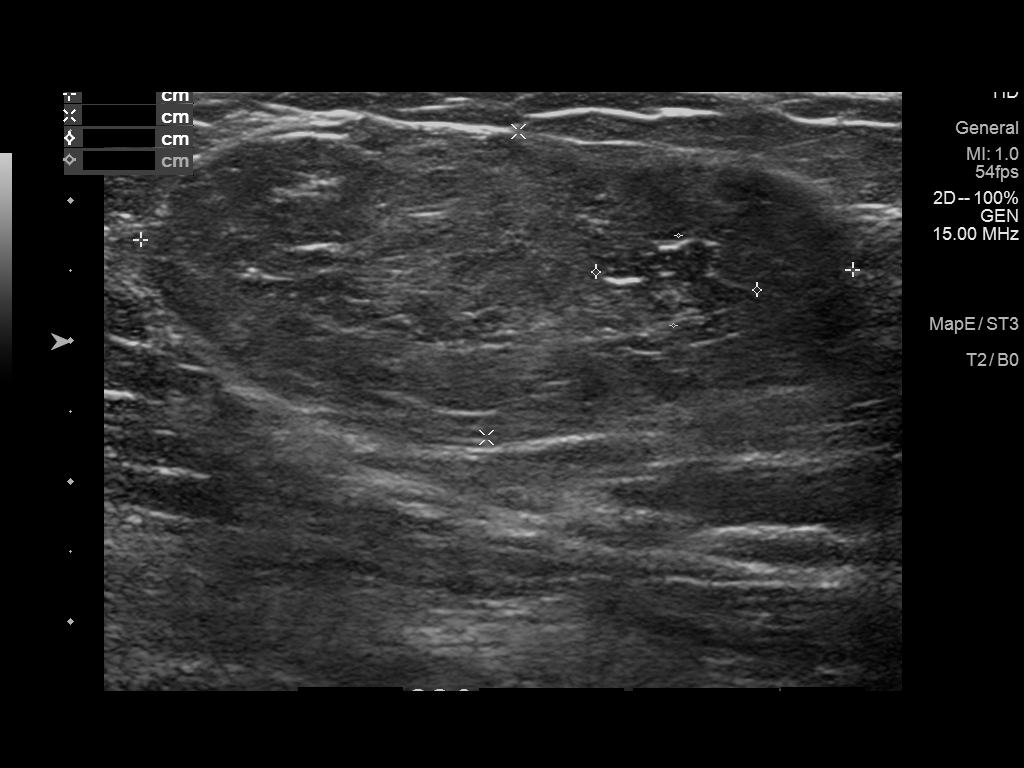

[6 of 6 positions shown; findings below may reference images not displayed]

ACR Breast Density Category b: There are scattered areas of
fibroglandular density.
FINDINGS: Cc and MLO views of bilateral breasts, spot tangential view of the
left breast are submitted. In the palpable area left breast slightly
lower slightly lateral position there is a predominately fatty mass
with internal fibroglandular tissue measuring 6.8 cm. This is
probably a hamartoma

Mammographic images were processed with CAD.

Targeted ultrasound is performed, showing predominately fatty mass
with internal fibroglandular tissue at the left breast 3 o'clock 2
cm from nipple palpable area measuring 3.2 x 5.1 x 2.3 cm. This
correlates to the mammographic finding.
IMPRESSION: Probable benign findings.

RECOMMENDATION:
Six-month follow-up mammogram left breast.

I have discussed the findings and recommendations with the patient.
Results were also provided in writing at the conclusion of the
visit. If applicable, a reminder letter will be sent to the patient
regarding the next appointment.

BI-RADS CATEGORY  3: Probably benign.

## 2018-08-15 ENCOUNTER — Ambulatory Visit
Admission: EM | Admit: 2018-08-15 | Discharge: 2018-08-15 | Disposition: A | Discharge status: Home / Self Care | Payer: BC Managed Care – PPO | Attending: Family Medicine | Admitting: Family Medicine

## 2018-08-15 ENCOUNTER — Other Ambulatory Visit: Payer: Self-pay

## 2018-08-15 ENCOUNTER — Encounter: Payer: Self-pay | Admitting: Emergency Medicine

## 2018-08-15 DIAGNOSIS — H6693 Otitis media, unspecified, bilateral: Secondary | ICD-10-CM

## 2018-08-15 DIAGNOSIS — R0981 Nasal congestion: Secondary | ICD-10-CM

## 2018-08-15 DIAGNOSIS — R05 Cough: Secondary | ICD-10-CM | POA: Diagnosis not present

## 2018-08-15 DIAGNOSIS — R059 Cough, unspecified: Secondary | ICD-10-CM

## 2018-08-15 MED ORDER — HYDROCOD POLST-CPM POLST ER 10-8 MG/5ML PO SUER: 5.0000 mL | Freq: Two times a day (BID) | ORAL | 0 refills | Status: DC | PRN

## 2018-08-15 MED ORDER — AMOXICILLIN 875 MG PO TABS: 875.0000 mg | ORAL_TABLET | Freq: Two times a day (BID) | ORAL | 0 refills | Status: DC

## 2018-08-15 NOTE — ED Triage Notes (Signed)
Patient states she has facial pain since last Friday, sinus pain and drainage, ears hurt and now has developed a cough

## 2018-08-15 NOTE — Discharge Instructions (Addendum)
Flonase nasal spray °

## 2018-08-15 NOTE — ED Provider Notes (Signed)
[facility]    CSN: [id number] Arrival date & time: 08/15/18  0845     History   Chief Complaint Chief Complaint  Patient presents with  . Facial Pain    HPI Carolyn Marquez is a 58 y.o. female.   The history is provided by the patient.  URI  Presenting symptoms: congestion, cough, ear pain and rhinorrhea   Severity:  Moderate Onset quality:  Sudden Duration:  5 days Timing:  Constant Progression:  Worsening Chronicity:  New Relieved by:  Nothing Ineffective treatments:  OTC medications Associated symptoms: sinus pain   Associated symptoms: no wheezing   Risk factors: sick contacts     Past Medical History:  Diagnosis Date  . Anemia   . Anxiety   . Breast mass    5 o'clock left present @ 1 month "quarter sized"  . Bronchitis   . Complication of anesthesia    PT GETS VERY ANXIOUS COMING OUT OF ANESTHESIA AND FEELS LIKE SHE CANT BREATHE  . Diabetes mellitus without complication (HCC)   . Diabetic neuropathy (HCC)   . GERD (gastroesophageal reflux disease)    H/O -NO CURRENT PROBLEMS  . Headache    MIGRAINES  . Hyperlipidemia     Patient Active Problem List   Diagnosis Date Noted  . Breast mass in female   . Breathlessness on exertion 07/15/2015  . Diabetes mellitus, type 2 (HCC) 11/07/2014  . Diabetes mellitus with polyneuropathy (HCC) 11/07/2014  . Blood glucose elevated 11/07/2014  . Excess weight 11/07/2014  . Esophagitis, reflux 10/01/2014  . Combined fat and carbohydrate induced hyperlipemia 10/01/2014    Past Surgical History:  Procedure Laterality Date  . BREAST BIOPSY Left 10/22/2015   Procedure: BREAST BIOPSY, left;  Surgeon: Lattie Haw, MD;  Location: [facility] ORS;  Service: General;  Laterality: Left;  . CESAREAN SECTION     x 2  . CLAVICLE SURGERY Right    bone spur  . FOOT SURGERY     X4  . SHOULDER SURGERY      OB History   No obstetric history on file.      Home Medications    Prior to Admission medications     Medication Sig Start Date End Date Taking? Authorizing Provider  amitriptyline (ELAVIL) 25 MG tablet Take 1 tablet by mouth at bedtime. 09/14/15 09/13/16  [provider]  amoxicillin (AMOXIL) 875 MG tablet Take 1 tablet (875 mg total) by mouth 2 (two) times daily. 08/15/18   Payton Mccallum, MD  chlorpheniramine-HYDROcodone (TUSSIONEX PENNKINETIC ER) 10-8 MG/5ML SUER Take 5 mLs by mouth every 12 (twelve) hours as needed. 08/15/18   Payton Mccallum, MD  glucose blood test strip 1 strip daily. 10/30/14   [provider]  Insulin Glargine-Lixisenatide (SOLIQUA) 100-33 UNT-MCG/ML SOPN Inject 36 Units into the skin every morning.  09/14/15   [provider]  metFORMIN (GLUCOPHAGE) 1000 MG tablet Take 1 tablet (1,000 mg total) by mouth 2 (two) times daily. Patient taking differently: Take 1,000 mg by mouth 2 (two) times daily. Dr Mervyn Skeeters 05/20/15   Duanne Limerick, MD  rosuvastatin (CRESTOR) 5 MG tablet Take 1 tablet by mouth every morning.  07/27/15 07/26/16  [provider]    Family History Family History  Problem Relation Age of Onset  . Cancer Father   . Diabetes Sister   . Breast cancer Neg Hx     Social History Social History   Tobacco Use  . Smoking status: Former Smoker  Packs/day: 0.50    Years: 10.00    Pack years: 5.00    Types: Cigarettes    Last attempt to quit: 10/14/2008    Years since quitting: 9.8  . Smokeless tobacco: Never Used  Substance Use Topics  . Alcohol use: No    Alcohol/week: 0.0 standard drinks  . Drug use: No     Allergies   Patient has no known allergies.   Review of Systems Review of Systems  HENT: Positive for congestion, ear pain, rhinorrhea and sinus pain.   Respiratory: Positive for cough. Negative for wheezing.      Physical Exam Triage Vital Signs ED Triage Vitals  Enc Vitals Group     BP 08/15/18 0904 122/77     Pulse Rate 08/15/18 0904 67     Resp 08/15/18 0904 18     Temp 08/15/18 0904 98 F (36.7 C)      Temp Source 08/15/18 0904 Oral     SpO2 08/15/18 0904 100 %     Weight 08/15/18 0900 210 lb (95.3 kg)     Height 08/15/18 0900 5\' 2"  (1.575 m)     Head Circumference --      Peak Flow --      Pain Score 08/15/18 0859 6     Pain Loc --      Pain Edu? --      Excl. in GC? --    No data found.  Updated Vital Signs BP 122/77 (BP Location: Left Arm)   Pulse 67   Temp 98 F (36.7 C) (Oral)   Resp 18   Ht 5\' 2"  (1.575 m)   Wt 95.3 kg   SpO2 100%   BMI 38.41 kg/m   Visual Acuity Right Eye Distance:   Left Eye Distance:   Bilateral Distance:    Right Eye Near:   Left Eye Near:    Bilateral Near:     Physical Exam Vitals signs and nursing note reviewed.  Constitutional:      General: She is not in acute distress.    Appearance: She is well-developed. She is not toxic-appearing or diaphoretic.  HENT:     Head: Normocephalic and atraumatic.     Right Ear: Ear canal and external ear normal. A middle ear effusion is present. Tympanic membrane is erythematous and bulging.     Left Ear: Ear canal and external ear normal. A middle ear effusion is present. Tympanic membrane is erythematous and bulging.     Nose: Congestion and rhinorrhea present. No septal deviation.     Mouth/Throat:     Pharynx: Uvula midline. No oropharyngeal exudate.  Neck:     Musculoskeletal: Normal range of motion and neck supple.     Thyroid: No thyromegaly.  Cardiovascular:     Rate and Rhythm: Normal rate and regular rhythm.     Heart sounds: Normal heart sounds.  Pulmonary:     Effort: Pulmonary effort is normal. No respiratory distress.     Breath sounds: Normal breath sounds. No stridor. No wheezing, rhonchi or rales.  Lymphadenopathy:     Cervical: No cervical adenopathy.  Neurological:     Mental Status: She is alert.      UC Treatments / Results  Labs (all labs ordered are listed, but only abnormal results are displayed) Labs Reviewed - No data to  display  EKG None  Radiology No results found.  Procedures Procedures (including critical care time)  Medications Ordered in UC Medications - No  data to display  Initial Impression / Assessment and Plan / UC Course  I have reviewed the triage vital signs and the nursing notes.  Pertinent labs & imaging results that were available during my care of the patient were reviewed by me and considered in my medical decision making (see chart for details).      Final Clinical Impressions(s) / UC Diagnoses   Final diagnoses:  Acute otitis media, bilateral  Cough     Discharge Instructions     Flonase nasal spray    ED Prescriptions    Medication Sig Dispense Auth. Provider   amoxicillin (AMOXIL) 875 MG tablet Take 1 tablet (875 mg total) by mouth 2 (two) times daily. 20 tablet Payton Mccallumonty, Deltha Bernales, MD   chlorpheniramine-HYDROcodone [facility](TUSSIONEX PENNKINETIC ER) 10-8 MG/5ML SUER Take 5 mLs by mouth every 12 (twelve) hours as needed. 60 mL Payton Mccallumonty, Margarita Croke, MD      1. diagnosis reviewed with patient 2. rx as per orders above; reviewed possible side effects, interactions, risks and benefits  3. Recommend supportive treatment with rest, fluids, otc analgesics 4. Follow-up prn if symptoms worsen or don't improve   Controlled Substance Prescriptions [place] Controlled Substance Registry consulted? Not Applicable   Payton Mccallumonty, Christien Frankl, MD 08/15/18 [phone]

## 2019-05-20 ENCOUNTER — Other Ambulatory Visit: Payer: Self-pay | Admitting: *Deleted

## 2019-05-20 DIAGNOSIS — Z20822 Contact with and (suspected) exposure to covid-19: Secondary | ICD-10-CM

## 2019-05-21 LAB — SPECIMEN STATUS REPORT

## 2019-05-21 LAB — NOVEL CORONAVIRUS, NAA: SARS-CoV-2, NAA: NOT DETECTED

## 2023-05-18 ENCOUNTER — Ambulatory Visit: Payer: BC Managed Care – PPO | Admitting: Family Medicine

## 2023-05-18 ENCOUNTER — Encounter: Payer: Self-pay | Admitting: Family Medicine

## 2023-05-18 VITALS — BP 138/88 | HR 78 | Ht 62.0 in | Wt 195.2 lb

## 2023-05-18 DIAGNOSIS — H6993 Unspecified Eustachian tube disorder, bilateral: Secondary | ICD-10-CM

## 2023-05-18 DIAGNOSIS — H6503 Acute serous otitis media, bilateral: Secondary | ICD-10-CM

## 2023-05-18 MED ORDER — PREDNISONE 10 MG PO TABS: 10.0000 mg | ORAL_TABLET | Freq: Every day | ORAL | 0 refills | Status: AC

## 2023-05-18 NOTE — Progress Notes (Signed)
Date:  05/18/2023   Name:  Carolyn Marquez   DOB:  1960/09/28   MRN:  [id number]   Chief Complaint: No chief complaint on file.  Otalgia  There is pain in the left (muffled/roar right eat) ear. This is a new problem. The current episode started in the past 7 days (started saturday). The problem has been unchanged. There has been no fever. The pain is at a severity of 0/10. The pain is moderate. Pertinent negatives include no coughing, ear discharge, headaches, hearing loss, neck pain, rash, rhinorrhea or sore throat. The treatment provided no relief.    Lab Results  Component Value Date   NA 140 10/19/2015   K 4.2 10/19/2015   CO2 27 10/19/2015   GLUCOSE 179 (H) 10/19/2015   BUN 17 10/19/2015   CREATININE 0.60 10/19/2015   CALCIUM 9.5 10/19/2015   GFRNONAA >60 10/19/2015   Lab Results  Component Value Date   CHOL 253 (H) 06/25/2015   HDL 45 06/25/2015   LDLCALC 146 (H) 06/25/2015   TRIG 309 (H) 06/25/2015   CHOLHDL 5.6 (H) 06/25/2015   No results found for: "TSH" Lab Results  Component Value Date   HGBA1C 7.7 (H) 06/25/2015   Lab Results  Component Value Date   WBC 6.8 10/19/2015   HGB 13.1 10/19/2015   HCT 37.8 10/19/2015   MCV 83.6 10/19/2015   PLT 230 10/19/2015   No results found for: "ALT", "AST", "GGT", "ALKPHOS", "BILITOT" No results found for: "25OHVITD2", "25OHVITD3", "VD25OH"   Review of Systems  Constitutional:  Negative for diaphoresis, fever and unexpected weight change.  HENT:  Positive for ear pain. Negative for congestion, dental problem, ear discharge, hearing loss, nosebleeds, postnasal drip, rhinorrhea and sore throat.   Eyes:  Negative for visual disturbance.  Respiratory:  Negative for cough, shortness of breath and wheezing.   Cardiovascular:  Negative for chest pain, palpitations and leg swelling.  Musculoskeletal:  Negative for neck pain.  Skin:  Negative for rash.  Neurological:  Negative for headaches.    Patient Active Problem List    Diagnosis Date Noted   Breast mass in female    Breathlessness on exertion 07/15/2015   Diabetes mellitus, type 2 (HCC) 11/07/2014   Diabetes mellitus with polyneuropathy (HCC) 11/07/2014   Blood glucose elevated 11/07/2014   Excess weight 11/07/2014   Esophagitis, reflux 10/01/2014   Combined fat and carbohydrate induced hyperlipemia 10/01/2014    No Known Allergies  Past Surgical History:  Procedure Laterality Date   BREAST BIOPSY Left 10/22/2015   Procedure: BREAST BIOPSY, left;  Surgeon: Lattie Haw, MD;  Location: [facility] ORS;  Service: General;  Laterality: Left;   CESAREAN SECTION     x 2   CLAVICLE SURGERY Right    bone spur   FOOT SURGERY     X4   SHOULDER SURGERY      Social History   Tobacco Use   Smoking status: Former    Current packs/day: 0.00    Average packs/day: 0.5 packs/day for 10.0 years (5.0 ttl pk-yrs)    Types: Cigarettes    Start date: 10/15/1998    Quit date: 10/14/2008    Years since quitting: 14.6   Smokeless tobacco: Never  Substance Use Topics   Alcohol use: No    Alcohol/week: 0.0 standard drinks of alcohol   Drug use: No     Medication list has been reviewed and updated.  Current Meds  Medication Sig   glucose blood  test strip 1 strip daily.   Insulin Glargine-Lixisenatide (SOLIQUA) 100-33 UNT-MCG/ML SOPN Inject 36 Units into the skin every morning.    metFORMIN (GLUCOPHAGE) 1000 MG tablet Take 1 tablet (1,000 mg total) by mouth 2 (two) times daily. (Patient taking differently: Take 1,000 mg by mouth 2 (two) times daily. Dr A)       05/18/2023    3:12 PM  GAD 7 : Generalized Anxiety Score  Nervous, Anxious, on Edge 0  Control/stop worrying 0  Worry too much - different things 0  Trouble relaxing 0  Restless 0  Easily annoyed or irritable 0  Afraid - awful might happen 0  Total GAD 7 Score 0  Anxiety Difficulty Not difficult at all       05/18/2023    3:12 PM 09/07/2015    1:52 PM 08/18/2015    8:55 AM  Depression  screen PHQ 2/9  Decreased Interest 0 0 0  Down, Depressed, Hopeless 0 0 0  PHQ - 2 Score 0 0 0  Altered sleeping 0    Tired, decreased energy 0    Change in appetite 0    Feeling bad or failure about yourself  0    Trouble concentrating 0    Moving slowly or fidgety/restless 0    Suicidal thoughts 0    PHQ-9 Score 0    Difficult doing work/chores Not difficult at all      BP Readings from Last 3 Encounters:  05/18/23 138/88  08/15/18 122/77  10/30/15 124/84    Physical Exam Vitals and nursing note reviewed. Exam conducted with a chaperone present.  Constitutional:      General: She is not in acute distress.    Appearance: She is not diaphoretic.  HENT:     Head: Normocephalic and atraumatic.     Jaw: There is normal jaw occlusion.     Right Ear: Hearing and external ear normal. A middle ear effusion is present. There is no impacted cerumen. Tympanic membrane is not erythematous.     Left Ear: Hearing and external ear normal. A middle ear effusion is present. There is no impacted cerumen. Tympanic membrane is not erythematous.     Nose: Nose normal.     Mouth/Throat:     Mouth: Mucous membranes are moist.  Eyes:     General:        Right eye: No discharge.        Left eye: No discharge.     Conjunctiva/sclera: Conjunctivae normal.     Pupils: Pupils are equal, round, and reactive to light.  Neck:     Thyroid: No thyromegaly.     Vascular: No JVD.  Cardiovascular:     Rate and Rhythm: Normal rate and regular rhythm.     Heart sounds: Normal heart sounds. No murmur heard.    No friction rub. No gallop.  Pulmonary:     Effort: Pulmonary effort is normal. No respiratory distress.     Breath sounds: Normal breath sounds. No stridor. No wheezing, rhonchi or rales.  Chest:     Chest wall: No tenderness.  Abdominal:     General: Bowel sounds are normal.     Palpations: Abdomen is soft. There is no mass.     Tenderness: There is no abdominal tenderness. There is no  guarding.  Musculoskeletal:        General: Normal range of motion.     Cervical back: Normal range of motion and neck supple.  Lymphadenopathy:  Cervical: No cervical adenopathy.  Skin:    General: Skin is warm and dry.  Neurological:     Mental Status: She is alert.     Wt Readings from Last 3 Encounters:  05/18/23 195 lb 3.2 oz (88.5 kg)  08/15/18 210 lb (95.3 kg)  10/30/15 243 lb (110.2 kg)    BP 138/88   Pulse 78   Ht 5\' 2"  (1.575 m)   Wt 195 lb 3.2 oz (88.5 kg)   SpO2 97%   BMI 35.70 kg/m   Assessment and Plan: 1. Dysfunction of both eustachian tubes New onset.  Persistent.  Seen in urgent care over the weekend and was told that she had fluid behind her ears and was placed on Claritin and Flonase.  I agree with the Flonase I think and will switch to Claritin out for Sudafed her blood pressure is okay at 138/88 at a low dose 10 mg once a day every 8-12 hours.  In addition we will initiate prednisone and a taper of [id number] and encouraged the patient to quit smoking. - predniSONE (DELTASONE) 10 MG tablet; Take 1 tablet (10 mg total) by mouth daily with breakfast. Disregard one a day I wish to taper 4,4,4,3,3,3,2,2,2,1,1,1  Dispense: 30 tablet; Refill: 0  2. Non-recurrent acute serous otitis media of both ears As noted above - predniSONE (DELTASONE) 10 MG tablet; Take 1 tablet (10 mg total) by mouth daily with breakfast. Disregard one a day I wish to taper 4,4,4,3,3,3,2,2,2,1,1,1  Dispense: 30 tablet; Refill: 0     Elizabeth Sauer, MD

## 2023-05-18 NOTE — Patient Instructions (Signed)
Eustachian Tube Dysfunction  Eustachian tube dysfunction refers to a condition in which a blockage develops in the narrow passage that connects the middle ear to the back of the nose (eustachian tube). The eustachian tube regulates air pressure in the middle ear by letting air move between the ear and nose. It also helps to drain fluid from the middle ear space. Eustachian tube dysfunction can affect one or both ears. When the eustachian tube does not function properly, air pressure, fluid, or both can build up in the middle ear. What are the causes? This condition occurs when the eustachian tube becomes blocked or cannot open normally. Common causes of this condition include: Ear infections. Colds and other infections that affect the nose, mouth, and throat (upper respiratory tract). Allergies. Irritation from cigarette smoke. Irritation from stomach acid coming up into the esophagus (gastroesophageal reflux). The esophagus is the part of the body that moves food from the mouth to the stomach. Sudden changes in air pressure, such as from descending in an airplane or scuba diving. Abnormal growths in the nose or throat, such as: Growths that line the nose (nasal polyps). Abnormal growth of cells (tumors). Enlarged tissue at the back of the throat (adenoids). What increases the risk? You are more likely to develop this condition if: You smoke. You are overweight. You are a child who has: Certain birth defects of the mouth, such as cleft palate. Large tonsils or adenoids. What are the signs or symptoms? Common symptoms of this condition include: A feeling of fullness in the ear. Ear pain. Clicking or popping noises in the ear. Ringing in the ear (tinnitus). Hearing loss. Loss of balance. Dizziness. Symptoms may get worse when the air pressure around you changes, such as when you travel to an area of high elevation, fly on an airplane, or go scuba diving. How is this diagnosed? This  condition may be diagnosed based on: Your symptoms. A physical exam of your ears, nose, and throat. Tests, such as those that measure: The movement of your eardrum. Your hearing (audiometry). How is this treated? Treatment depends on the cause and severity of your condition. In mild cases, you may relieve your symptoms by moving air into your ears. This is called "popping the ears." In more severe cases, or if you have symptoms of fluid in your ears, treatment may include: Medicines to relieve congestion (decongestants). Medicines that treat allergies (antihistamines). Nasal sprays or ear drops that contain medicines that reduce swelling (steroids). A procedure to drain the fluid in your eardrum. In this procedure, a small tube may be placed in the eardrum to: Drain the fluid. Restore the air in the middle ear space. A procedure to insert a balloon device through the nose to inflate the opening of the eustachian tube (balloon dilation). Follow these instructions at home: Lifestyle Do not do any of the following until your health care provider approves: Travel to high altitudes. Fly in airplanes. Work in a pressurized cabin or room. Scuba dive. Do not use any products that contain nicotine or tobacco. These products include cigarettes, chewing tobacco, and vaping devices, such as e-cigarettes. If you need help quitting, ask your health care provider. Keep your ears dry. Wear fitted earplugs during showering and bathing. Dry your ears completely after. General instructions Take over-the-counter and prescription medicines only as told by your health care provider. Use techniques to help pop your ears as recommended by your health care provider. These may include: Chewing gum. Yawning. Frequent, forceful swallowing.   Closing your mouth, holding your nose closed, and gently blowing as if you are trying to blow air out of your nose. Keep all follow-up visits. This is important. Contact a  health care provider if: Your symptoms do not go away after treatment. Your symptoms come back after treatment. You are unable to pop your ears. You have: A fever. Pain in your ear. Pain in your head or neck. Fluid draining from your ear. Your hearing suddenly changes. You become very dizzy. You lose your balance. Get help right away if: You have a sudden, severe increase in any of your symptoms. Summary Eustachian tube dysfunction refers to a condition in which a blockage develops in the eustachian tube. It can be caused by ear infections, allergies, inhaled irritants, or abnormal growths in the nose or throat. Symptoms may include ear pain or fullness, hearing loss, or ringing in the ears. Mild cases are treated with techniques to unblock the ears, such as yawning or chewing gum. More severe cases are treated with medicines or procedures. This information is not intended to replace advice given to you by your health care provider. Make sure you discuss any questions you have with your health care provider. Document Revised: 08/24/2020 Document Reviewed: 08/24/2020 [employer]vier Patient Education  2024 [employer]  

## 2023-07-06 ENCOUNTER — Ambulatory Visit: Payer: 59 | Admitting: Family Medicine

## 2023-07-06 ENCOUNTER — Encounter: Payer: Self-pay | Admitting: Family Medicine

## 2023-07-06 VITALS — BP 126/78 | HR 63 | Temp 97.8°F | Ht 62.0 in | Wt 191.0 lb

## 2023-07-06 DIAGNOSIS — R059 Cough, unspecified: Secondary | ICD-10-CM | POA: Diagnosis not present

## 2023-07-06 DIAGNOSIS — J0100 Acute maxillary sinusitis, unspecified: Secondary | ICD-10-CM

## 2023-07-06 DIAGNOSIS — R0981 Nasal congestion: Secondary | ICD-10-CM | POA: Diagnosis not present

## 2023-07-06 DIAGNOSIS — R6889 Other general symptoms and signs: Secondary | ICD-10-CM

## 2023-07-06 LAB — POCT INFLUENZA A/B
Influenza A, POC: NEGATIVE
Influenza B, POC: NEGATIVE

## 2023-07-06 LAB — POC COVID19 BINAXNOW: SARS Coronavirus 2 Ag: NEGATIVE

## 2023-07-06 MED ORDER — AMOXICILLIN 500 MG PO CAPS: 500.0000 mg | ORAL_CAPSULE | Freq: Three times a day (TID) | ORAL | 0 refills | Status: AC

## 2023-07-06 NOTE — Progress Notes (Signed)
 Date:  07/06/2023   Name:  Carolyn Marquez   DOB:  January 20, 1961   MRN:  [id number]   Chief Complaint: Cough (X 5 days,getting worse, Ear pain, nausea, cloudy mucous, chills, wheezing, head congestion, has taking many OTC medications, does not help/)  Sinusitis This is a new problem. The current episode started in the past 7 days. The problem has been gradually worsening since onset. There has been no fever. The pain is moderate (left frontal sinus). Associated symptoms include congestion, coughing and ear pain. Pertinent negatives include no chills, diaphoresis, headaches, hoarse voice, neck pain, shortness of breath, sinus pressure, sneezing or sore throat. (Prod yellow green mucus)    Lab Results  Component Value Date   NA 140 10/19/2015   K 4.2 10/19/2015   CO2 27 10/19/2015   GLUCOSE 179 (H) 10/19/2015   BUN 17 10/19/2015   CREATININE 0.60 10/19/2015   CALCIUM 9.5 10/19/2015   GFRNONAA >60 10/19/2015   Lab Results  Component Value Date   CHOL 253 (H) 06/25/2015   HDL 45 06/25/2015   LDLCALC 146 (H) 06/25/2015   TRIG 309 (H) 06/25/2015   CHOLHDL 5.6 (H) 06/25/2015   No results found for: TSH Lab Results  Component Value Date   HGBA1C 7.7 (H) 06/25/2015   Lab Results  Component Value Date   WBC 6.8 10/19/2015   HGB 13.1 10/19/2015   HCT 37.8 10/19/2015   MCV 83.6 10/19/2015   PLT 230 10/19/2015   No results found for: ALT, AST, GGT, ALKPHOS, BILITOT No results found for: 25OHVITD2, 25OHVITD3, VD25OH   Review of Systems  Constitutional:  Negative for chills, diaphoresis and fever.  HENT:  Positive for congestion, ear pain, rhinorrhea and sinus pain. Negative for hoarse voice, nosebleeds, postnasal drip, sinus pressure, sneezing and sore throat.   Eyes:  Positive for itching. Negative for discharge and redness.  Respiratory:  Positive for cough. Negative for choking and shortness of breath.   Cardiovascular:  Negative for chest pain and palpitations.   Musculoskeletal:  Negative for neck pain.  Neurological:  Negative for headaches.    Patient Active Problem List   Diagnosis Date Noted   Breast mass in female    Breathlessness on exertion 07/15/2015   Diabetes mellitus, type 2 (HCC) 11/07/2014   Diabetes mellitus with polyneuropathy (HCC) 11/07/2014   Blood glucose elevated 11/07/2014   Excess weight 11/07/2014   Esophagitis, reflux 10/01/2014   Combined fat and carbohydrate induced hyperlipemia 10/01/2014    No Known Allergies  Past Surgical History:  Procedure Laterality Date   BREAST BIOPSY Left 10/22/2015   Procedure: BREAST BIOPSY, left;  Surgeon: Charlie FORBES Fell, MD;  Location: [facility];  Service: General;  Laterality: Left;   CESAREAN SECTION     x 2   CLAVICLE SURGERY Right    bone spur   FOOT SURGERY     X4   SHOULDER SURGERY      Social History   Tobacco Use   Smoking status: Former    Current packs/day: 0.00    Average packs/day: 0.5 packs/day for 10.0 years (5.0 ttl pk-yrs)    Types: Cigarettes    Start date: 10/15/1998    Quit date: 10/14/2008    Years since quitting: 14.7   Smokeless tobacco: Never  Substance Use Topics   Alcohol use: No    Alcohol/week: 0.0 standard drinks of alcohol   Drug use: No     Medication list has been reviewed and updated.  No outpatient medications have been marked as taking for the 07/06/23 encounter (Office Visit) with Joshua Cathryne BROCKS, MD.       07/06/2023    1:50 PM 05/18/2023    3:12 PM  GAD 7 : Generalized Anxiety Score  Nervous, Anxious, on Edge 0 0  Control/stop worrying 0 0  Worry too much - different things 0 0  Trouble relaxing 0 0  Restless 0 0  Easily annoyed or irritable 0 0  Afraid - awful might happen 0 0  Total GAD 7 Score 0 0  Anxiety Difficulty Not difficult at all Not difficult at all       07/06/2023    1:50 PM 05/18/2023    3:12 PM 09/07/2015    1:52 PM  Depression screen PHQ 2/9  Decreased Interest 0 0 0  Down, Depressed, Hopeless 0  0 0  PHQ - 2 Score 0 0 0  Altered sleeping  0   Tired, decreased energy  0   Change in appetite  0   Feeling bad or failure about yourself   0   Trouble concentrating  0   Moving slowly or fidgety/restless  0   Suicidal thoughts  0   PHQ-9 Score  0   Difficult doing work/chores  Not difficult at all     BP Readings from Last 3 Encounters:  07/06/23 126/78  05/18/23 138/88  08/15/18 122/77    Physical Exam Vitals and nursing note reviewed.  Constitutional:      General: She is not in acute distress.    Appearance: She is not diaphoretic.  HENT:     Head: Normocephalic and atraumatic.     Right Ear: Tympanic membrane, ear canal and external ear normal.     Left Ear: Tympanic membrane, ear canal and external ear normal.     Nose: Congestion and rhinorrhea present.     Right Turbinates: Swollen.     Left Turbinates: Swollen.     Right Sinus: No maxillary sinus tenderness or frontal sinus tenderness.     Left Sinus: Maxillary sinus tenderness and frontal sinus tenderness present.     Mouth/Throat:     Mouth: Mucous membranes are moist.     Pharynx: Oropharynx is clear. No posterior oropharyngeal erythema.  Eyes:     General:        Right eye: No discharge.        Left eye: No discharge.     Conjunctiva/sclera: Conjunctivae normal.     Pupils: Pupils are equal, round, and reactive to light.  Neck:     Thyroid: No thyromegaly.     Vascular: No JVD.  Cardiovascular:     Rate and Rhythm: Normal rate and regular rhythm.     Heart sounds: Normal heart sounds. No murmur heard.    No friction rub. No gallop.  Pulmonary:     Effort: Pulmonary effort is normal.     Breath sounds: Normal breath sounds. No wheezing or rhonchi.  Abdominal:     General: Bowel sounds are normal.     Palpations: Abdomen is soft. There is no mass.     Tenderness: There is no abdominal tenderness. There is no guarding.  Musculoskeletal:        General: Normal range of motion.     Cervical back:  Normal range of motion and neck supple.  Lymphadenopathy:     Cervical: No cervical adenopathy.  Skin:    General: Skin is warm and dry.  Neurological:     Mental Status: She is alert.     Deep Tendon Reflexes: Reflexes are normal and symmetric.     Wt Readings from Last 3 Encounters:  07/06/23 191 lb (86.6 kg)  05/18/23 195 lb 3.2 oz (88.5 kg)  08/15/18 210 lb (95.3 kg)    BP 126/78   Pulse 63   Temp 97.8 F (36.6 C)   Ht 5' 2 (1.575 m)   Wt 191 lb (86.6 kg)   SpO2 98%   BMI 34.93 kg/m   Assessment and Plan:  1. Acute non-recurrent maxillary sinusitis (Primary) New onset.  Persistent.  Stable.  Patient is symptomatic involving the left side of the facial area with tenderness over the frontal and maxillary sinuses on the left only.  Remainder exam is unremarkable.  This is an exam and history consistent with sinusitis and we will treat with amoxicillin  500 mg 3 times a day for 10 days and urged to continue her Sudafed and to initiate Mucinex  DM for cough and mucolytic action. - amoxicillin  (AMOXIL ) 500 MG capsule; Take 1 capsule (500 mg total) by mouth 3 (three) times daily for 10 days.  Dispense: 30 capsule; Refill: 0  2. Flu-like symptoms Discussed and administered - POC COVID-19 - POCT Influenza A/B    Cathryne Molt, MD
